# Patient Record
Sex: Male | Born: 1990 | Race: White | Hispanic: No | Marital: Single | Smoking: Current every day smoker
Health system: Southern US, Community
[De-identification: ages and names within clinical notes are randomized; demographics above are authoritative.]

## PROBLEM LIST (undated history)

## (undated) DIAGNOSIS — F32A Depression, unspecified: Secondary | ICD-10-CM

## (undated) DIAGNOSIS — F319 Bipolar disorder, unspecified: Secondary | ICD-10-CM

## (undated) DIAGNOSIS — F429 Obsessive-compulsive disorder, unspecified: Secondary | ICD-10-CM

## (undated) DIAGNOSIS — F329 Major depressive disorder, single episode, unspecified: Secondary | ICD-10-CM

## (undated) DIAGNOSIS — F419 Anxiety disorder, unspecified: Secondary | ICD-10-CM

---
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
## 2012-10-31 ENCOUNTER — Encounter (HOSPITAL_COMMUNITY): Payer: Self-pay | Admitting: Emergency Medicine

## 2012-10-31 ENCOUNTER — Emergency Department (HOSPITAL_COMMUNITY)
Admission: EM | Admit: 2012-10-31 | Discharge: 2012-10-31 | Disposition: A | Discharge status: Home / Self Care | Payer: Medicare Other | Attending: Emergency Medicine | Admitting: Emergency Medicine

## 2012-10-31 DIAGNOSIS — K089 Disorder of teeth and supporting structures, unspecified: Secondary | ICD-10-CM | POA: Insufficient documentation

## 2012-10-31 DIAGNOSIS — R000 Tachycardia, unspecified: Secondary | ICD-10-CM | POA: Insufficient documentation

## 2012-10-31 DIAGNOSIS — F17200 Nicotine dependence, unspecified, uncomplicated: Secondary | ICD-10-CM | POA: Insufficient documentation

## 2012-10-31 DIAGNOSIS — K0889 Other specified disorders of teeth and supporting structures: Secondary | ICD-10-CM

## 2012-10-31 MED ORDER — IBUPROFEN 800 MG PO TABS: 800.0000 mg | ORAL_TABLET | Freq: Three times a day (TID) | ORAL | Status: DC

## 2012-10-31 MED ORDER — TRAMADOL HCL 50 MG PO TABS
50.0000 mg | ORAL_TABLET | Freq: Once | ORAL | Status: AC
  Administered 2012-10-31: 50 mg via ORAL
  Filled 2012-10-31: qty 1

## 2012-10-31 MED ORDER — IBUPROFEN 800 MG PO TABS
800.0000 mg | ORAL_TABLET | Freq: Once | ORAL | Status: AC
  Administered 2012-10-31: 800 mg via ORAL
  Filled 2012-10-31: qty 1

## 2012-10-31 MED ORDER — TRAMADOL HCL 50 MG PO TABS: 50.0000 mg | ORAL_TABLET | Freq: Four times a day (QID) | ORAL | Status: DC | PRN

## 2012-10-31 MED ORDER — PENICILLIN V POTASSIUM 500 MG PO TABS: 500.0000 mg | ORAL_TABLET | Freq: Four times a day (QID) | ORAL | Status: DC

## 2012-10-31 NOTE — ED Notes (Signed)
Patient c/o left upper molar pain; states feels like a "pus pocket" under tooth.

## 2012-10-31 NOTE — ED Provider Notes (Signed)
CSN: [id number]     Arrival date & time 10/31/12  0245 History   First MD Initiated Contact with Patient 10/31/12 0248     Chief Complaint  Patient presents with  . Dental Pain   (Consider location/radiation/quality/duration/timing/severity/associated sxs/prior Treatment) Patient is a 22 y.o. male presenting with tooth pain. The history is provided by the patient.  Dental Pain Associated symptoms: no congestion, no drooling, no facial swelling, no fever, no headaches and no neck pain    patient with complaint of left upper molar pain feels like his of pus pocket underneath the tooth. Patient's been having pain there for a few days. Pain is currently 10 out of 10. Throbbing ache has gotten worse in the past 2 days. Not made better or worse by anything.  History reviewed. No pertinent past medical history. History reviewed. No pertinent past surgical history. No family history on file. History  Substance Use Topics  . Smoking status: Current Every Day Smoker  . Smokeless tobacco: Not on file  . Alcohol Use: Yes    Review of Systems  Constitutional: Negative for fever.  HENT: Positive for dental problem. Negative for congestion, facial swelling, drooling and neck pain.   Eyes: Negative for redness.  Respiratory: Negative for shortness of breath.   Cardiovascular: Negative for chest pain.  Gastrointestinal: Negative for abdominal pain.  Skin: Negative for rash.  Neurological: Negative for headaches.  Psychiatric/Behavioral: Negative for confusion.    Allergies  Clindamycin/lincomycin and Tylenol  Home Medications   Current Outpatient Rx  Name  Route  Sig  Dispense  Refill  . ibuprofen (ADVIL,MOTRIN) 800 MG tablet   Oral   Take 1 tablet (800 mg total) by mouth 3 (three) times daily.   21 tablet   0   . penicillin v potassium (VEETID) 500 MG tablet   Oral   Take 1 tablet (500 mg total) by mouth 4 (four) times daily.   40 tablet   0   . traMADol (ULTRAM) 50 MG tablet  Oral   Take 1 tablet (50 mg total) by mouth every 6 (six) hours as needed.   20 tablet   0    BP 168/104  Pulse 101  Temp(Src) 98 F (36.7 C) (Oral)  Resp 20  Ht 5\' 11"  (1.803 m)  Wt 264 lb (119.75 kg)  BMI 36.84 kg/m2  SpO2 99% Physical Exam  Nursing note and vitals reviewed. Constitutional: He is oriented to person, place, and time. He appears well-developed and well-nourished. No distress.  HENT:  Head: Normocephalic and atraumatic.  Mouth/Throat: Oropharynx is clear and moist.  Except for left upper last molar with an avulsion fracture and significant decay. No gum swelling no facial swelling no bleeding no purulent discharge.  Eyes: Conjunctivae and EOM are normal. Pupils are equal, round, and reactive to light.  Neck: Normal range of motion.  Cardiovascular: Normal rate, regular rhythm and normal heart sounds.   Borderline tachycardia.  Pulmonary/Chest: Effort normal and breath sounds normal. No respiratory distress.  Abdominal: Soft. Bowel sounds are normal. There is no tenderness.  Lymphadenopathy:    He has no cervical adenopathy.  Neurological: He is alert and oriented to person, place, and time. No cranial nerve deficit. He exhibits normal muscle tone. Coordination normal.  Skin: Skin is warm. No rash noted. No erythema.    ED Course  Procedures (including critical care time) Labs Review Labs Reviewed - No data to display Imaging Review No results found.  MDM   1.  Toothache    Patient with left upper last molar with significant avulsion fracture indicated. No evidence of significant gum swelling or facial swelling. Will treat with antibiotic pain medicine anti-inflammatory medicine.    Shelda Jakes, MD 10/31/12 [phone]

## 2012-12-28 ENCOUNTER — Emergency Department (HOSPITAL_COMMUNITY)
Admission: EM | Admit: 2012-12-28 | Discharge: 2012-12-28 | Disposition: A | Discharge status: Home / Self Care | Payer: Medicare Other | Attending: Emergency Medicine | Admitting: Emergency Medicine

## 2012-12-28 ENCOUNTER — Emergency Department (HOSPITAL_COMMUNITY): Payer: Medicare Other

## 2012-12-28 ENCOUNTER — Encounter (HOSPITAL_COMMUNITY): Payer: Self-pay | Admitting: Emergency Medicine

## 2012-12-28 DIAGNOSIS — S4980XA Other specified injuries of shoulder and upper arm, unspecified arm, initial encounter: Secondary | ICD-10-CM | POA: Insufficient documentation

## 2012-12-28 DIAGNOSIS — S43491A Other sprain of right shoulder joint, initial encounter: Secondary | ICD-10-CM

## 2012-12-28 DIAGNOSIS — S46909A Unspecified injury of unspecified muscle, fascia and tendon at shoulder and upper arm level, unspecified arm, initial encounter: Secondary | ICD-10-CM | POA: Insufficient documentation

## 2012-12-28 DIAGNOSIS — S43431A Superior glenoid labrum lesion of right shoulder, initial encounter: Secondary | ICD-10-CM

## 2012-12-28 DIAGNOSIS — Y9389 Activity, other specified: Secondary | ICD-10-CM | POA: Insufficient documentation

## 2012-12-28 DIAGNOSIS — F17200 Nicotine dependence, unspecified, uncomplicated: Secondary | ICD-10-CM | POA: Insufficient documentation

## 2012-12-28 DIAGNOSIS — Y92410 Unspecified street and highway as the place of occurrence of the external cause: Secondary | ICD-10-CM | POA: Insufficient documentation

## 2012-12-28 DIAGNOSIS — M549 Dorsalgia, unspecified: Secondary | ICD-10-CM

## 2012-12-28 DIAGNOSIS — IMO0002 Reserved for concepts with insufficient information to code with codable children: Secondary | ICD-10-CM | POA: Insufficient documentation

## 2012-12-28 MED ORDER — OXYCODONE-ACETAMINOPHEN 5-325 MG PO TABS
2.0000 | ORAL_TABLET | Freq: Once | ORAL | Status: AC
  Administered 2012-12-28: 2 via ORAL
  Filled 2012-12-28: qty 2

## 2012-12-28 MED ORDER — OXYCODONE-ACETAMINOPHEN 5-325 MG PO TABS: 2.0000 | ORAL_TABLET | ORAL | Status: DC | PRN

## 2012-12-28 NOTE — Discharge Instructions (Signed)
Shoulder Fracture Followup with Dr. Hilda Lias this week. Return to the ED if you develop they're worsening symptoms including weakness, numbness or tingling. You have a fractured humerus (bone in the upper arm) at the shoulder just below the ball of the shoulder joint. Most of the time the bones of a broken shoulder are in an acceptable position. Usually the injury can be treated with a shoulder immobilizer or sling and swath bandage. These devices support the arm and prevent any shoulder movement. If the bones are not in a good position, then surgery is sometimes needed. Shoulder fractures usually cause swelling, pain, and discoloration around the upper arm initially. They heal in 8-12 weeks with proper treatment. Rest in bed or a reclining chair as long as your shoulder is very painful. Sitting up generally results in less pain at the fracture site. Do not remove your shoulder bandage until your caregiver approves. You may apply ice packs over the shoulder for 20-30 minutes every 2 hours for the next 2-3 days to reduce the pain and swelling. Use your pain medicine as prescribed.  SEEK IMMEDIATE MEDICAL CARE IF:  You develop severe shoulder pain unrelieved by rest and taking pain medicine.  You have pain, numbness, tingling, or weakness in the hand or wrist.  You develop shortness of breath, chest pain, severe weakness, or fainting.  You have severe pain with motion of the fingers or wrist. MAKE SURE YOU:   Understand these instructions.  Will watch your condition.  Will get help right away if you are not doing well or get worse. Document Released: 02/22/2004 Document Revised: 04/08/2011 Document Reviewed: 05/04/2008 [facility] Patient Information 2014 [place], [place].

## 2012-12-28 NOTE — ED Notes (Signed)
MD at bedside. 
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
## 2013-02-03 ENCOUNTER — Ambulatory Visit (HOSPITAL_COMMUNITY): Admission: RE | Admit: 2013-02-03 | Payer: Medicare Other | Source: Ambulatory Visit | Admitting: Specialist

## 2013-02-18 ENCOUNTER — Ambulatory Visit (HOSPITAL_COMMUNITY): Payer: Medicare Other | Admitting: Occupational Therapy

## 2013-02-19 ENCOUNTER — Ambulatory Visit (HOSPITAL_COMMUNITY): Payer: Medicare Other | Admitting: Occupational Therapy

## 2013-03-08 ENCOUNTER — Emergency Department (HOSPITAL_COMMUNITY): Payer: Medicare Other

## 2013-03-08 ENCOUNTER — Encounter (HOSPITAL_COMMUNITY): Payer: Self-pay | Admitting: Emergency Medicine

## 2013-03-08 ENCOUNTER — Emergency Department (HOSPITAL_COMMUNITY)
Admission: EM | Admit: 2013-03-08 | Discharge: 2013-03-08 | Disposition: A | Discharge status: Home / Self Care | Payer: Medicare Other | Attending: Emergency Medicine | Admitting: Emergency Medicine

## 2013-03-08 DIAGNOSIS — K760 Fatty (change of) liver, not elsewhere classified: Secondary | ICD-10-CM

## 2013-03-08 DIAGNOSIS — I10 Essential (primary) hypertension: Secondary | ICD-10-CM

## 2013-03-08 DIAGNOSIS — Z8659 Personal history of other mental and behavioral disorders: Secondary | ICD-10-CM | POA: Insufficient documentation

## 2013-03-08 DIAGNOSIS — R1011 Right upper quadrant pain: Secondary | ICD-10-CM | POA: Insufficient documentation

## 2013-03-08 DIAGNOSIS — R109 Unspecified abdominal pain: Secondary | ICD-10-CM

## 2013-03-08 DIAGNOSIS — F17200 Nicotine dependence, unspecified, uncomplicated: Secondary | ICD-10-CM | POA: Insufficient documentation

## 2013-03-08 DIAGNOSIS — K7689 Other specified diseases of liver: Secondary | ICD-10-CM | POA: Insufficient documentation

## 2013-03-08 DIAGNOSIS — R1110 Vomiting, unspecified: Secondary | ICD-10-CM | POA: Insufficient documentation

## 2013-03-08 DIAGNOSIS — R197 Diarrhea, unspecified: Secondary | ICD-10-CM | POA: Insufficient documentation

## 2013-03-08 HISTORY — DX: Obsessive-compulsive disorder, unspecified: F42.9

## 2013-03-08 HISTORY — DX: Major depressive disorder, single episode, unspecified: F32.9

## 2013-03-08 HISTORY — DX: Depression, unspecified: F32.A

## 2013-03-08 HISTORY — DX: Bipolar disorder, unspecified: F31.9

## 2013-03-08 HISTORY — DX: Anxiety disorder, unspecified: F41.9

## 2013-03-08 LAB — CBC WITH DIFFERENTIAL/PLATELET
Basophils Absolute: 0 10*3/uL (ref 0.0–0.1)
Basophils Relative: 0 % (ref 0–1)
EOS ABS: 0.1 10*3/uL (ref 0.0–0.7)
EOS PCT: 0 % (ref 0–5)
HEMATOCRIT: 49.3 % (ref 39.0–52.0)
Hemoglobin: 17.8 g/dL — ABNORMAL HIGH (ref 13.0–17.0)
Lymphocytes Relative: 31 % (ref 12–46)
Lymphs Abs: 3.9 10*3/uL (ref 0.7–4.0)
MCH: 30.7 pg (ref 26.0–34.0)
MCHC: 36.1 g/dL — ABNORMAL HIGH (ref 30.0–36.0)
MCV: 85 fL (ref 78.0–100.0)
MONOS PCT: 7 % (ref 3–12)
Monocytes Absolute: 1 10*3/uL (ref 0.1–1.0)
Neutro Abs: 7.9 10*3/uL — ABNORMAL HIGH (ref 1.7–7.7)
Neutrophils Relative %: 61 % (ref 43–77)
PLATELETS: 244 10*3/uL (ref 150–400)
RBC: 5.8 MIL/uL (ref 4.22–5.81)
RDW: 12.7 % (ref 11.5–15.5)
WBC: 12.8 10*3/uL — ABNORMAL HIGH (ref 4.0–10.5)

## 2013-03-08 LAB — COMPREHENSIVE METABOLIC PANEL
ALT: 30 U/L (ref 0–53)
AST: 21 U/L (ref 0–37)
Albumin: 4.7 g/dL (ref 3.5–5.2)
Alkaline Phosphatase: 51 U/L (ref 39–117)
BUN: 11 mg/dL (ref 6–23)
CALCIUM: 10.5 mg/dL (ref 8.4–10.5)
CO2: 23 meq/L (ref 19–32)
CREATININE: 1 mg/dL (ref 0.50–1.35)
Chloride: 103 mEq/L (ref 96–112)
GLUCOSE: 80 mg/dL (ref 70–99)
Potassium: 3.6 mEq/L — ABNORMAL LOW (ref 3.7–5.3)
SODIUM: 142 meq/L (ref 137–147)
TOTAL PROTEIN: 8.6 g/dL — AB (ref 6.0–8.3)
Total Bilirubin: 0.9 mg/dL (ref 0.3–1.2)

## 2013-03-08 LAB — URINALYSIS, ROUTINE W REFLEX MICROSCOPIC
GLUCOSE, UA: NEGATIVE mg/dL
Hgb urine dipstick: NEGATIVE
Ketones, ur: 15 mg/dL — AB
Leukocytes, UA: NEGATIVE
Nitrite: NEGATIVE
Protein, ur: NEGATIVE mg/dL
Specific Gravity, Urine: 1.02 (ref 1.005–1.030)
UROBILINOGEN UA: 1 mg/dL (ref 0.0–1.0)
pH: 7 (ref 5.0–8.0)

## 2013-03-08 LAB — LIPASE, BLOOD: Lipase: 19 U/L (ref 11–59)

## 2013-03-08 MED ORDER — OXYCODONE-ACETAMINOPHEN 5-325 MG PO TABS
1.0000 | ORAL_TABLET | Freq: Once | ORAL | Status: AC
  Administered 2013-03-08: 1 via ORAL
  Filled 2013-03-08: qty 1

## 2013-03-08 MED ORDER — OMEPRAZOLE 20 MG PO CPDR: 20.0000 mg | DELAYED_RELEASE_CAPSULE | Freq: Every day | ORAL | Status: DC

## 2013-03-08 MED ORDER — OXYCODONE-ACETAMINOPHEN 5-325 MG PO TABS: 1.0000 | ORAL_TABLET | Freq: Three times a day (TID) | ORAL | Status: DC | PRN

## 2013-03-08 MED ORDER — HYDROMORPHONE HCL PF 1 MG/ML IJ SOLN
1.0000 mg | Freq: Once | INTRAMUSCULAR | Status: AC
  Administered 2013-03-08: 1 mg via INTRAVENOUS
  Filled 2013-03-08: qty 1

## 2013-03-08 MED ORDER — OXYCODONE-ACETAMINOPHEN 5-325 MG PO TABS: 1.0000 | ORAL_TABLET | Freq: Once | ORAL | Status: DC

## 2013-03-08 MED ORDER — SODIUM CHLORIDE 0.9 % IV BOLUS (SEPSIS)
1000.0000 mL | Freq: Once | INTRAVENOUS | Status: AC
  Administered 2013-03-08: 1000 mL via INTRAVENOUS

## 2013-03-08 MED ORDER — IOHEXOL 300 MG/ML  SOLN
50.0000 mL | Freq: Once | INTRAMUSCULAR | Status: AC | PRN
Start: 2013-03-08 — End: 2013-03-08
  Administered 2013-03-08: 50 mL via ORAL

## 2013-03-08 MED ORDER — IOHEXOL 300 MG/ML  SOLN
100.0000 mL | Freq: Once | INTRAMUSCULAR | Status: AC | PRN
  Administered 2013-03-08: 100 mL via INTRAVENOUS

## 2013-03-08 MED ORDER — ONDANSETRON 8 MG PO TBDP: 8.0000 mg | ORAL_TABLET | Freq: Once | ORAL | Status: DC

## 2013-03-08 MED ORDER — ONDANSETRON HCL 4 MG/2ML IJ SOLN
4.0000 mg | Freq: Once | INTRAMUSCULAR | Status: AC
  Administered 2013-03-08: 4 mg via INTRAVENOUS
  Filled 2013-03-08: qty 2

## 2013-03-08 NOTE — ED Notes (Signed)
Patient c/o right flank/upper abd pain x4 days. Patient reports nausea, vomiting, and diarrhea. Denies any fevers or urinary symptoms.

## 2013-03-08 NOTE — ED Notes (Signed)
Pt requesting additional pain medication.  Explained to pt that he received his third dose approximately 30 minutes ago, and at this time, medication has not had time to reach full effect.

## 2013-03-08 NOTE — ED Provider Notes (Signed)
CSN: [id number]     Arrival date & time 03/08/13  1309 History   First MD Initiated Contact with Patient 03/08/13 1556     Chief Complaint  Patient presents with  . Flank Pain     Patient is a 23 y.o. male presenting with abdominal pain. The history is provided by the patient.  Abdominal Pain Pain location:  RUQ Pain quality: sharp   Pain radiates to:  Does not radiate Pain severity:  Severe Onset quality:  Gradual Duration:  4 days Timing:  Constant Progression:  Worsening Chronicity:  New Relieved by:  Nothing Worsened by:  Palpation Associated symptoms: diarrhea and vomiting   Associated symptoms: no chest pain and no fever   pt reports RUQ pain for past 4 days that is not improving He has never had this before.      Past Medical History  Diagnosis Date  . Anxiety   . Depression   . OCD (obsessive compulsive disorder)   . Bipolar 1 disorder    History reviewed. No pertinent past surgical history. Family History  Problem Relation Age of Onset  . Heart attack Father   . Cancer Other   . Diabetes Other    History  Substance Use Topics  . Smoking status: Current Every Day Smoker -- 0.50 packs/day for 10 years    Types: Cigarettes  . Smokeless tobacco: Former NeurosurgeonUser    Types: Chew, Snuff  . Alcohol Use: Yes     Comment: occ    Review of Systems  Constitutional: Negative for fever.  Cardiovascular: Negative for chest pain.  Gastrointestinal: Positive for vomiting, abdominal pain and diarrhea.  All other systems reviewed and are negative.      Allergies  Clindamycin/lincomycin  Home Medications   Current Outpatient Rx  Name  Route  Sig  Dispense  Refill  . ibuprofen (ADVIL,MOTRIN) 200 MG tablet   Oral   Take 600 mg by mouth every 8 (eight) hours as needed.           BP 170/100  Pulse 87  Temp(Src) 97.9 F (36.6 C)  Resp 22  Ht 6' (1.829 m)  Wt 240 lb (108.863 kg)  BMI 32.54 kg/m2  SpO2 100% Physical Exam CONSTITUTIONAL: Well developed/well  nourished HEAD: Normocephalic/atraumatic EYES: EOMI/PERRL, no icterus ENMT: Mucous membranes moist NECK: supple no meningeal signs SPINE:entire spine nontender CV: S1/S2 noted, no murmurs/rubs/gallops noted LUNGS: Lungs are clear to auscultation bilaterally, no apparent distress ABDOMEN: soft, moderate RUQ tenderness noted, no rebound or guarding GU:no cva tenderness NEURO: Pt is awake/alert, moves all extremitiesx4 EXTREMITIES: pulses normal, full ROM SKIN: warm, color normal PSYCH: uncomfortable appearing  ED Course  Procedures (including critical care time) 5:25 PM Pt with continued abd pain, no signs of biliary colic on US imaging Will proceed with CT imaging      At time of discharge, pt was improved His abdomen was soft.  He had point tenderness to RUQ but no RLQ tenderness.   His CT imaging was negative Labs were reassuring He was informed of fatty infiltration of liver and need for f/u He may benefit from outpatient HIDA scan.   At this point I feel he is safe for d/c home and outpatient workup We discussed strict return precautions   Labs Review Labs Reviewed  URINALYSIS, ROUTINE W REFLEX MICROSCOPIC  CBC WITH DIFFERENTIAL  COMPREHENSIVE METABOLIC PANEL  LIPASE, BLOOD   Imaging Review [place] Abdomen Limited Ruq  03/08/2013   CLINICAL DATA:  Upper abdominal  pain  EXAM: US ABDOMEN LIMITED - RIGHT UPPER QUADRANT  COMPARISON:  None.  FINDINGS: Gallbladder:  No gallstones or wall thickening visualized. There is no pericholecystic fluid. No sonographic Murphy sign noted.  Common bile duct:  Diameter: 3 mm. There is no intrahepatic or extrahepatic biliary duct dilatation.  Liver:  Liver is prominent, measuring 15.7 cm in length. There is diffuse increased echogenicity in the liver. No focal liver lesions are identified.  IMPRESSION: Liver is prominent with increased echogenicity, most likely indicative of fatty change. While no focal liver lesions are identified, it must be  cautioned that the sensitivity of ultrasound for focal liver lesions is diminished in this circumstance. Study otherwise unremarkable.   Electronically Signed   By: Bretta Bang M.D.   On: 03/08/2013 16:55    EKG Interpretation   None       MDM   Final diagnoses:  None    Nursing notes including past medical history and social history reviewed and considered in documentation Labs/vital reviewed and considered     Joya Gaskins, MD 03/08/13 2205

## 2013-03-08 NOTE — Discharge Instructions (Signed)
Arterial Hypertension °Arterial hypertension (high blood pressure) is a condition of elevated pressure in your blood vessels. Hypertension over a long period of time is a risk factor for strokes, heart attacks, and heart failure. It is also the leading cause of kidney (renal) failure.  °CAUSES  °· In Adults -- Over 90% of all hypertension has no known cause. This is called essential or primary hypertension. In the other 10% of people with hypertension, the increase in blood pressure is caused by another disorder. This is called secondary hypertension. Important causes of secondary hypertension are: °· Heavy alcohol use. °· Obstructive sleep apnea. °· Hyperaldosterosim (Conn's syndrome). °· Steroid use. °· Chronic kidney failure. °· Hyperparathyroidism. °· Medications. °· Renal artery stenosis. °· Pheochromocytoma. °· Cushing's disease. °· Coarctation of the aorta. °· Scleroderma renal crisis. °· Licorice (in excessive amounts). °· Drugs (cocaine, methamphetamine). °Your caregiver can explain any items above that apply to you. °· In Children -- Secondary hypertension is more common and should always be considered. °· Pregnancy -- Few women of childbearing age have high blood pressure. However, up to 10% of them develop hypertension of pregnancy. Generally, this will not harm the woman. It may be a sign of 3 complications of pregnancy: preeclampsia, HELLP syndrome, and eclampsia. Follow up and control with medication is necessary. °SYMPTOMS  °· This condition normally does not produce any noticeable symptoms. It is usually found during a routine exam. °· Malignant hypertension is a late problem of high blood pressure. It may have the following symptoms: °· Headaches. °· Blurred vision. °· End-organ damage (this means your kidneys, heart, lungs, and other organs are being damaged). °· Stressful situations can increase the blood pressure. If a person with normal blood pressure has their blood pressure go up while being  seen by their caregiver, this is often termed "white coat hypertension." Its importance is not known. It may be related with eventually developing hypertension or complications of hypertension. °· Hypertension is often confused with mental tension, stress, and anxiety. °DIAGNOSIS  °The diagnosis is made by 3 separate blood pressure measurements. They are taken at least 1 week apart from each other. If there is organ damage from hypertension, the diagnosis may be made without repeat measurements. °Hypertension is usually identified by having blood pressure readings: °· Above 140/90 mmHg measured in both arms, at 3 separate times, over a couple weeks. °· Over 130/80 mmHg should be considered a risk factor and may require treatment in patients with diabetes. °Blood pressure readings over 120/80 mmHg are called "pre-hypertension" even in non-diabetic patients. °To get a true blood pressure measurement, use the following guidelines. Be aware of the factors that can alter blood pressure readings. °· Take measurements at least 1 hour after caffeine. °· Take measurements 30 minutes after smoking and without any stress. This is another reason to quit smoking  it raises your blood pressure. °· Use a proper cuff size. Ask your caregiver if you are not sure about your cuff size. °· Most home blood pressure cuffs are automatic. They will measure systolic and diastolic pressures. The systolic pressure is the pressure reading at the start of sounds. Diastolic pressure is the pressure at which the sounds disappear. If you are elderly, measure pressures in multiple postures. Try sitting, lying or standing. °· Sit at rest for a minimum of 5 minutes before taking measurements. °· You should not be on any medications like decongestants. These are found in many cold medications. °· Record your blood pressure readings and review   them with your caregiver. °If you have hypertension: °· Your caregiver may do tests to be sure you do not have  secondary hypertension (see "causes" above). °· Your caregiver may also look for signs of metabolic syndrome. This is also called Syndrome X or Insulin Resistance Syndrome. You may have this syndrome if you have type 2 diabetes, abdominal obesity, and abnormal blood lipids in addition to hypertension. °· Your caregiver will take your medical and family history and perform a physical exam. °· Diagnostic tests may include blood tests (for glucose, cholesterol, potassium, and kidney function), a urinalysis, or an EKG. Other tests may also be necessary depending on your condition. °PREVENTION  °There are important lifestyle issues that you can adopt to reduce your chance of developing hypertension: °· Maintain a normal weight. °· Limit the amount of salt (sodium) in your diet. °· Exercise often. °· Limit alcohol intake. °· Get enough potassium in your diet. Discuss specific advice with your caregiver. °· Follow a DASH diet (dietary approaches to stop hypertension). This diet is rich in fruits, vegetables, and low-fat dairy products, and avoids certain fats. °PROGNOSIS  °Essential hypertension cannot be cured. Lifestyle changes and medical treatment can lower blood pressure and reduce complications. The prognosis of secondary hypertension depends on the underlying cause. Many people whose hypertension is controlled with medicine or lifestyle changes can live a normal, healthy life.  °RISKS AND COMPLICATIONS  °While high blood pressure alone is not an illness, it often requires treatment due to its short- and long-term effects on many organs. Hypertension increases your risk for: °· CVAs or strokes (cerebrovascular accident). °· Heart failure due to chronically high blood pressure (hypertensive cardiomyopathy). °· Heart attack (myocardial infarction). °· Damage to the retina (hypertensive retinopathy). °· Kidney failure (hypertensive nephropathy). °Your caregiver can explain list items above that apply to you. Treatment  of hypertension can significantly reduce the risk of complications. °TREATMENT  °· For overweight patients, weight loss and regular exercise are recommended. Physical fitness lowers blood pressure. °· Mild hypertension is usually treated with diet and exercise. A diet rich in fruits and vegetables, fat-free dairy products, and foods low in fat and salt (sodium) can help lower blood pressure. Decreasing salt intake decreases blood pressure in a 1/3 of people. °· Stop smoking if you are a smoker. °The steps above are highly effective in reducing blood pressure. While these actions are easy to suggest, they are difficult to achieve. Most patients with moderate or severe hypertension end up requiring medications to bring their blood pressure down to a normal level. There are several classes of medications for treatment. Blood pressure pills (antihypertensives) will lower blood pressure by their different actions. Lowering the blood pressure by 10 mmHg may decrease the risk of complications by as much as 25%. °The goal of treatment is effective blood pressure control. This will reduce your risk for complications. Your caregiver will help you determine the best treatment for you according to your lifestyle. What is excellent treatment for one person, may not be for you. °HOME CARE INSTRUCTIONS  °· Do not smoke. °· Follow the lifestyle changes outlined in the "Prevention" section. °· If you are on medications, follow the directions carefully. Blood pressure medications must be taken as prescribed. Skipping doses reduces their benefit. It also puts you at risk for problems. °· Follow up with your caregiver, as directed. °· If you are asked to monitor your blood pressure at home, follow the guidelines in the "Diagnosis" section above. °SEEK MEDICAL CARE   IF:   You think you are having medication side effects.  You have recurrent headaches or lightheadedness.  You have swelling in your ankles.  You have trouble with  your vision. SEEK IMMEDIATE MEDICAL CARE IF:   You have sudden onset of chest pain or pressure, difficulty breathing, or other symptoms of a heart attack.  You have a severe headache.  You have symptoms of a stroke (such as sudden weakness, difficulty speaking, difficulty walking). MAKE SURE YOU:   Understand these instructions.  Will watch your condition.  Will get help right away if you are not doing well or get worse. Document Released: 01/14/2005 Document Revised: 04/08/2011 Document Reviewed: 08/14/2006 [facility] Patient Information 2014 [place], [place].   Abdominal (belly) pain can be caused by many things. any cases can be observed and treated at home after initial evaluation in the emergency department. Even though you are being discharged home, abdominal pain can be unpredictable. Therefore, you need a repeated exam if your pain does not resolve, returns, or worsens. Most patients with abdominal pain don't have to be admitted to the hospital or have surgery, but serious problems like appendicitis and gallbladder attacks can start out as nonspecific pain. Many abdominal conditions cannot be diagnosed in one visit, so follow-up evaluations are very important. SEEK IMMEDIATE MEDICAL ATTENTION IF: The pain does not go away or becomes severe, particularly over the next 8-12 hours.  A temperature above 100.59F develops.  Repeated vomiting occurs (multiple episodes).  The pain becomes localized to portions of the abdomen. The right side could possibly be appendicitis. In an adult, the left lower portion of the abdomen could be colitis or diverticulitis.  Blood is being passed in stools or vomit (bright red or black tarry stools).  Return also if you develop chest pain, difficulty breathing, dizziness or fainting, or become confused, poorly responsive, or inconsolable (young children).

## 2013-03-10 ENCOUNTER — Ambulatory Visit (INDEPENDENT_AMBULATORY_CARE_PROVIDER_SITE_OTHER): Payer: Medicare Other | Admitting: Gastroenterology

## 2013-03-10 ENCOUNTER — Encounter: Payer: Self-pay | Admitting: Gastroenterology

## 2013-03-10 ENCOUNTER — Other Ambulatory Visit: Payer: Self-pay | Admitting: Gastroenterology

## 2013-03-10 VITALS — BP 144/84 | HR 90 | Temp 98.9°F | Ht 72.0 in | Wt 273.6 lb

## 2013-03-10 DIAGNOSIS — R109 Unspecified abdominal pain: Secondary | ICD-10-CM

## 2013-03-10 DIAGNOSIS — R1011 Right upper quadrant pain: Secondary | ICD-10-CM

## 2013-03-10 MED ORDER — IBUPROFEN 600 MG PO TABS: ORAL_TABLET | ORAL | Status: DC

## 2013-03-10 NOTE — Patient Instructions (Addendum)
GET YOUR CHEST XRAY/RIB FLIMS TODAY.  FOLLOW UP WITH PHYSICAL THERAPY.  YOU MAY USE IBUPROFEN 600 MG TID FOR 10 DAYS. CALL IF YOU CHANGE YOUR MIND ABOUT THE STEROIDS.  USE NEURONTIN THREE TIMES A DAY TO HELP WITH PAIN.  ICE TO RIBS/RIGHT UPPER ABDOMEN 10 MINS THREE TIMES A DAY.  UPPER ENDOSCOPY ON FEB 16 WITH PROPOFOL.  FOLLOW UP IN 2 MOS.

## 2013-03-10 NOTE — Progress Notes (Addendum)
Subjective:    Patient ID: Christopher Houston, male    DOB: 1990-05-07, 23 y.o.   MRN: [id number] CLAGGETT,ELIN, PA-C  HPI Pain in ruq for 5 days. STARTED WHEN HE WAS OUT IN THE SHOP BUSTING WOOD FOR ABOUT AN HOUR AND AFTER 2 HOURS THEN STARTED MOWING GRASS THAT DAY 2-3 DAYS AFTER PAIN WOKE UP WITH HOT AND COLD SWEATS/VOMITING/LOOSE STOOLS. HARD TO KEEP ANYTHING. USES IBUPROFEN: 2X/DAY. NO ASPIRIN, BC/GOODY POWDERS, OR NAPROXEN/ALEVE. RARE ETOH. DOESN'T THINK IT'S A PULLED MUSCLE. PAIN WORSE WITH EATING, LAYING ON IT, BENDING OVER, TURNING TO RIGHT, AND TAKING A DEEP BREATH. LAST VOMITED 2 DAYS AGO. LAST DIARRHEA: THIS AM. NO WHITE STOOLS/ITCHING. PT DENIES FEVER, CHILLS, BRBPR,  melena, constipation, problems swallowing, OR problems with sedation. ICE HELPED FOR 15 MINS. PRILOSEC HELPS heartburn or indigestion. NO EGD/TCS. NO WEIGHT LOSS. NO MEDS FOR MENTAL HEALTH. BROKE R SHOULDER 3 MOS AGO RIDING A FOUR WHEELER. WAS ON PERCOCET 7.5 THEN.  Past Medical History  Diagnosis Date  . Anxiety   . Depression   . OCD (obsessive compulsive disorder)   . Bipolar 1 disorder    No past surgical history on file.  Allergies  Allergen Reactions  . Clindamycin/Lincomycin Other (See Comments)    Hot, sweaty and shaky   Current Outpatient Prescriptions  Medication Sig Dispense Refill  . ibuprofen (ADVIL,MOTRIN) 200 MG tablet Take 600 mg by mouth every 8 (eight) hours as needed.   DECREASES PAIN    . omeprazole (PRILOSEC) 20 MG capsule Take 1 capsule (20 mg total) by mouth daily.    Marland Kitchen. oxyCODONE-acetaminophen (PERCOCET/ROXICET) 5-325 MG per tablet Take 1 tablet by mouth every 8 (eight) hours as needed for severe pain. Pt said he has one tablet left       Family History  Problem Relation Age of Onset  . Heart attack Father   . Cancer Other G-FATHER, G-MOTHER  . Diabetes Other   NO COLON, GASTRIC, PANCREAS CA.  History  Substance Use Topics  . Smoking status: Current Every Day Smoker -- 0.50  packs/day for 10 years    Types: Cigarettes  . Smokeless tobacco: Former NeurosurgeonUser    Types: Snuff, Chew     Comment: 1/2 pack daily  . Alcohol Use: Yes     Comment: occ   SEXUALLY ACTIVE ACTIVE: LAST TIME 1 WEEK AGO. USES CONDOM(EVERY TIME)     Review of Systems PER HPI OTHERWISE ALL SYSTEMS ARE NEGATIVE.     Objective:   Physical Exam  Vitals reviewed. Constitutional: He is oriented to person, place, and time. He appears well-nourished. No distress.  HENT:  Head: Normocephalic and atraumatic.  Mouth/Throat: Oropharynx is clear and moist. No oropharyngeal exudate.  Eyes: Pupils are equal, round, and reactive to light. No scleral icterus.  Neck: Normal range of motion. Neck supple.  Cardiovascular: Normal rate, regular rhythm and normal heart sounds.   Pulmonary/Chest: Effort normal and breath sounds normal. No respiratory distress.  Abdominal: Soft. Bowel sounds are normal. He exhibits no distension. There is tenderness (MOD TTP IN RUQ WITH PALPATION IN OTHER QUADRANTS.). There is no rebound and no guarding.  POSTIVE CARNETT'S SIGN WITH RAISING LEGS AND HEAD/NECK OF THE EXAM  Musculoskeletal: He exhibits no edema.  Lymphadenopathy:    He has no cervical adenopathy.  Neurological: He is alert and oriented to person, place, and time.  NO FOCAL DEFICITS   Psychiatric:  FLAT AFFECT, NL MOOD  Assessment & Plan:

## 2013-03-10 NOTE — Assessment & Plan Note (Addendum)
ACUTE IN CHRONIC PAIN.PT IN OBVIOUS DISTRESS. SX MOST LIKELY DUE TO ABD WALL PAIN, LESS LIKELY RIB FRACTURE, PNEUMOTHORAX , LESS LIKELY RETROCECAL APPENDICITIS.   GET YOUR CHEST XRAY/RIB FLIMS TODAY.  FOLLOW UP WITH PHYSICAL THERAPY. USE NEURONTIN THREE TIMES A DAY TO HELP WITH PAIN. ICE TO RIBS/RIGHT UPPER ABDOMEN 10 MINS THREE TIMES A DAY. UPPER ENDOSCOPY ON FEB 16 WITH PROPOFOL. I PERSONALLY REVIEWED CT WITH DR. Tyron RussellBOLES. CONSIDER REPEAT CT IF SX NOT IMPROVED IN 7 DAYS. FOLLOW UP IN 2 MOS.

## 2013-03-11 NOTE — Progress Notes (Signed)
CALLED AND SPOKE WITH RADIOLOGY(DEE). PT FAILED TO SHOW FOR HIS CXR YESTERDAY.

## 2013-03-11 NOTE — Progress Notes (Signed)
cc'd to pcp 

## 2013-03-12 NOTE — Progress Notes (Signed)
Reminder in epic °

## 2013-03-16 ENCOUNTER — Encounter (HOSPITAL_COMMUNITY): Payer: Self-pay | Admitting: Pharmacy Technician

## 2013-03-17 ENCOUNTER — Encounter (HOSPITAL_COMMUNITY): Admission: RE | Admit: 2013-03-17 | Payer: Medicare Other | Source: Ambulatory Visit

## 2013-03-17 ENCOUNTER — Telehealth: Payer: Self-pay | Admitting: Gastroenterology

## 2013-03-17 ENCOUNTER — Telehealth: Payer: Self-pay

## 2013-03-17 NOTE — Telephone Encounter (Signed)
I have LMOM for patient to return my call.  

## 2013-03-17 NOTE — Telephone Encounter (Signed)
[facility] called to let us know that patient was a no show for his pre op

## 2013-03-17 NOTE — Telephone Encounter (Signed)
Pt return your call and would like to reschedule his EGD. He said to just call him at [phone].

## 2013-03-18 ENCOUNTER — Other Ambulatory Visit: Payer: Self-pay | Admitting: Gastroenterology

## 2013-03-18 NOTE — Telephone Encounter (Signed)
EGD w/Propofol has been R/S to 03/10 new instructions have been mailed

## 2013-03-18 NOTE — Telephone Encounter (Signed)
I LMOM for a return call

## 2013-03-29 NOTE — Patient Instructions (Signed)
Christopher Houston  03/29/2013   Your procedure is scheduled on:  04/06/13  Report to [facility] at 06:15 AM.  Call this number if you have problems the morning of surgery: [phone]   Remember:   Do not eat food or drink liquids after midnight.   Take these medicines the morning of surgery with A SIP OF WATER: Omeprazole.   Do not wear jewelry, make-up or nail polish.  Do not wear lotions, powders, or perfumes. You may wear deodorant.  Do not shave 48 hours prior to surgery. Men may shave face and neck.  Do not bring valuables to the hospital.  [facility] is not responsible for any belongings or valuables.               Contacts, dentures or bridgework may not be worn into surgery.  Leave suitcase in the car. After surgery it may be brought to your room.  For patients admitted to the hospital, discharge time is determined by your treatment team.               Patients discharged the day of surgery will not be allowed to drive home.   Special Instructions: N/A   Please read over the following fact sheets that you were given: Anesthesia Post-op Instructions and Care and Recovery After Surgery    Esophagogastroduodenoscopy Esophagogastroduodenoscopy (EGD) is a procedure to examine the lining of the esophagus, stomach, and first part of the small intestine (duodenum). A long, flexible, lighted tube with a camera attached (endoscope) is inserted down the throat to view these organs. This procedure is done to detect problems or abnormalities, such as inflammation, bleeding, ulcers, or growths, in order to treat them. The procedure lasts about 5 20 minutes. It is usually an outpatient procedure, but it may need to be performed in emergency cases in the hospital. LET YOUR CAREGIVER KNOW ABOUT:   Allergies to food or medicine.  All medicines you are taking, including vitamins, herbs, eyedrops, and over-the-counter medicines and creams.  Use of steroids (by mouth or creams).  Previous  problems you or members of your family have had with the use of anesthetics.  Any blood disorders you have.  Previous surgeries you have had.  Other health problems you have.  Possibility of pregnancy, if this applies. RISKS AND COMPLICATIONS  Generally, EGD is a safe procedure. However, as with any procedure, complications can occur. Possible complications include:  Infection.  Bleeding.  Tearing (perforation) of the esophagus, stomach, or duodenum.  Difficulty breathing or not being able to breath.  Excessive sweating.  Spasms of the larynx.  Slowed heartbeat.  Low blood pressure. BEFORE THE PROCEDURE  Do not eat or drink anything for 6 8 hours before the procedure or as directed by your caregiver.  Ask your caregiver about changing or stopping your regular medicines.  If you wear dentures, be prepared to remove them before the procedure.  Arrange for someone to drive you home after the procedure. PROCEDURE   A vein will be accessed to give medicines and fluids. A medicine to relax you (sedative) and a pain reliever will be given through that access into the vein.  A numbing medicine (local anesthetic) may be sprayed on your throat for comfort and to stop you from gagging or coughing.  A mouth guard may be placed in your mouth to protect your teeth and to keep you from biting on the endoscope.  You will be asked to lie on your left side.  The endoscope is inserted down your throat and into the esophagus, stomach, and duodenum.  Air is put through the endoscope to allow your caregiver to view the lining of your esophagus clearly.  The esophagus, stomach, and duodenum is then examined. During the exam, your caregiver may:  Remove tissue to be examined under a microscope (biopsy) for inflammation, infection, or other medical problems.  Remove growths.  Remove objects (foreign bodies) that are stuck.  Treat any bleeding with medicines or other devices that stop  tissues from bleeding (hot cauters, clipping devices).  Widen (dilate) or stretch narrowed areas of the esophagus and stomach.  The endoscope will then be withdrawn. AFTER THE PROCEDURE  You will be taken to a recovery area to be monitored. You will be able to go home once you are stable and alert.  Do not eat or drink anything until the local anesthetic and numbing medicines have worn off. You may choke.  It is normal to feel bloated, have pain with swallowing, or have a sore throat for a short time. This will wear off.  Your caregiver should be able to discuss his or her findings with you. It will take longer to discuss the test results if any biopsies were taken. Document Released: 05/17/2004 Document Revised: 01/01/2012 Document Reviewed: 12/18/2011 [facility] Patient Information 2014 [place]Care, [place].    PATIENT INSTRUCTIONS POST-ANESTHESIA  IMMEDIATELY FOLLOWING SURGERY:  Do not drive or operate machinery for the first twenty four hours after surgery.  Do not make any important decisions for twenty four hours after surgery or while taking narcotic pain medications or sedatives.  If you develop intractable nausea and vomiting or a severe headache please notify your doctor immediately.  FOLLOW-UP:  Please make an appointment with your surgeon as instructed. You do not need to follow up with anesthesia unless specifically instructed to do so.  WOUND CARE INSTRUCTIONS (if applicable):  Keep a dry clean dressing on the anesthesia/puncture wound site if there is drainage.  Once the wound has quit draining you may leave it open to air.  Generally you should leave the bandage intact for twenty four hours unless there is drainage.  If the epidural site drains for more than 36-48 hours please call the anesthesia department.  QUESTIONS?:  Please feel free to call your physician or the hospital operator if you have any questions, and they will be happy to assist you.

## 2013-03-30 ENCOUNTER — Encounter (HOSPITAL_COMMUNITY)
Admission: RE | Admit: 2013-03-30 | Discharge: 2013-03-30 | Disposition: A | Discharge status: Home / Self Care | Payer: Medicare Other | Source: Ambulatory Visit | Attending: Gastroenterology | Admitting: Gastroenterology

## 2013-03-30 ENCOUNTER — Telehealth: Payer: Self-pay | Admitting: Gastroenterology

## 2013-03-30 NOTE — Telephone Encounter (Signed)
One of the nurses from Short Stay called to let us know that patient was a no show for his Pre OP visit and they tried calling him and was told that he wasn't there and didn't know when he would be back. He is scheduled with [facility] on 3/10

## 2013-03-30 NOTE — Telephone Encounter (Signed)
I have Left a message with a family member for him to call us back

## 2013-03-31 ENCOUNTER — Encounter: Payer: Self-pay | Admitting: General Practice

## 2013-03-31 NOTE — Telephone Encounter (Signed)
Discharge letter mailed  

## 2013-03-31 NOTE — Telephone Encounter (Signed)
EGD has been canceled.

## 2013-03-31 NOTE — Telephone Encounter (Signed)
CANCEL EGD. PT NEEDS A LETTER TERMINATING THE DOCTOR PT RELATIONSHIP. HE NO SHOWED FOR XRAY AND PREOPx2.

## 2013-04-06 ENCOUNTER — Encounter (HOSPITAL_COMMUNITY): Admission: RE | Payer: Self-pay | Source: Ambulatory Visit

## 2013-04-06 ENCOUNTER — Ambulatory Visit (HOSPITAL_COMMUNITY): Admission: RE | Admit: 2013-04-06 | Payer: Medicare Other | Source: Ambulatory Visit | Admitting: Gastroenterology

## 2013-04-06 SURGERY — ESOPHAGOGASTRODUODENOSCOPY (EGD) WITH PROPOFOL
Anesthesia: Monitor Anesthesia Care

## 2013-12-03 ENCOUNTER — Emergency Department (HOSPITAL_COMMUNITY)
Admission: EM | Admit: 2013-12-03 | Discharge: 2013-12-04 | Disposition: A | Discharge status: Home / Self Care | Payer: Medicare Other | Attending: Emergency Medicine | Admitting: Emergency Medicine

## 2013-12-03 ENCOUNTER — Encounter (HOSPITAL_COMMUNITY): Payer: Self-pay | Admitting: *Deleted

## 2013-12-03 DIAGNOSIS — K029 Dental caries, unspecified: Secondary | ICD-10-CM | POA: Insufficient documentation

## 2013-12-03 DIAGNOSIS — Z720 Tobacco use: Secondary | ICD-10-CM | POA: Insufficient documentation

## 2013-12-03 DIAGNOSIS — Z8659 Personal history of other mental and behavioral disorders: Secondary | ICD-10-CM | POA: Insufficient documentation

## 2013-12-03 DIAGNOSIS — K088 Other specified disorders of teeth and supporting structures: Secondary | ICD-10-CM | POA: Diagnosis present

## 2013-12-03 DIAGNOSIS — K047 Periapical abscess without sinus: Secondary | ICD-10-CM | POA: Insufficient documentation

## 2013-12-03 NOTE — ED Notes (Signed)
Pt states he has several bad teeth on the left side of his mouth. Pt c/o pain to face.

## 2013-12-04 DIAGNOSIS — K047 Periapical abscess without sinus: Secondary | ICD-10-CM | POA: Diagnosis not present

## 2013-12-04 MED ORDER — AMOXICILLIN 500 MG PO CAPS: 500.0000 mg | ORAL_CAPSULE | Freq: Three times a day (TID) | ORAL | Status: DC

## 2013-12-04 MED ORDER — HYDROCODONE-ACETAMINOPHEN 5-325 MG PO TABS: 1.0000 | ORAL_TABLET | ORAL | Status: DC | PRN

## 2013-12-04 MED ORDER — AMOXICILLIN 250 MG PO CAPS
500.0000 mg | ORAL_CAPSULE | Freq: Once | ORAL | Status: AC
  Administered 2013-12-04: 500 mg via ORAL
  Filled 2013-12-04: qty 2

## 2013-12-04 MED ORDER — HYDROCODONE-ACETAMINOPHEN 5-325 MG PO TABS
2.0000 | ORAL_TABLET | Freq: Once | ORAL | Status: AC
  Administered 2013-12-04: 2 via ORAL
  Filled 2013-12-04: qty 2

## 2013-12-04 NOTE — Discharge Instructions (Signed)
Dental Abscess °A dental abscess is a collection of infected fluid (pus) from a bacterial infection in the inner part of the tooth (pulp). It usually occurs at the end of the tooth's root.  °CAUSES  °· Severe tooth decay. °· Trauma to the tooth that allows bacteria to enter into the pulp, such as a broken or chipped tooth. °SYMPTOMS  °· Severe pain in and around the infected tooth. °· Swelling and redness around the abscessed tooth or in the mouth or face. °· Tenderness. °· Pus drainage. °· Bad breath. °· Bitter taste in the mouth. °· Difficulty swallowing. °· Difficulty opening the mouth. °· Nausea. °· Vomiting. °· Chills. °· Swollen neck glands. °DIAGNOSIS  °· A medical and dental history will be taken. °· An examination will be performed by tapping on the abscessed tooth. °· X-rays may be taken of the tooth to identify the abscess. °TREATMENT °The goal of treatment is to eliminate the infection. You may be prescribed antibiotic medicine to stop the infection from spreading. A root canal may be performed to save the tooth. If the tooth cannot be saved, it may be pulled (extracted) and the abscess may be drained.  °HOME CARE INSTRUCTIONS °· Only take over-the-counter or prescription medicines for pain, fever, or discomfort as directed by your caregiver. °· Rinse your mouth (gargle) often with salt water (¼ tsp salt in 8 oz [250 ml] of warm water) to relieve pain or swelling. °· Do not drive after taking pain medicine (narcotics). °· Do not apply heat to the outside of your face. °· Return to your dentist for further treatment as directed. °SEEK MEDICAL CARE IF: °· Your pain is not helped by medicine. °· Your pain is getting worse instead of better. °SEEK IMMEDIATE MEDICAL CARE IF: °· You have a fever or persistent symptoms for more than 2-3 days. °· You have a fever and your symptoms suddenly get worse. °· You have chills or a very bad headache. °· You have problems breathing or swallowing. °· You have trouble  opening your mouth. °· You have swelling in the neck or around the eye. °Document Released: 01/14/2005 Document Revised: 10/09/2011 Document Reviewed: 04/24/2010 °ExitCare® Patient Information ©2015 [employer]. This information is not intended to replace advice given to you by your health care provider. Make sure you discuss any questions you have with your health care provider. ° ° °Complete your entire course of antibiotics as prescribed.  You  may use the hydrocodone for pain relief but do not drive within 4 hours of taking as this will make you drowsy.  Avoid applying heat or ice to this abscess area which can worsen your symptoms.  You may use warm salt water swish and spit treatment or half peroxide and water swish and spit after meals to keep this area clean as discussed.  Call the dentist listed above for further management of your symptoms. ° ° °

## 2013-12-04 NOTE — ED Provider Notes (Signed)
CSN: [id number]     Arrival date & time 12/03/13  2221 History   First MD Initiated Contact with Patient 12/03/13 2354     Chief Complaint  Patient presents with  . Dental Pain     (Consider location/radiation/quality/duration/timing/severity/associated sxs/prior Treatment) Patient is a 23 y.o. male presenting with tooth pain. The history is provided by the patient.  Dental Pain Location:  Upper Upper teeth location:  16/LU 3rd molar Quality:  Sharp Severity:  Severe Onset quality:  Gradual Duration:  3 days Timing:  Constant Progression:  Worsening Chronicity:  New Context: dental caries and dental fracture   Relieved by:  Nothing Worsened by:  Pressure and touching Ineffective treatments:  NSAIDs Associated symptoms: facial pain and gum swelling   Associated symptoms: no difficulty swallowing, no drooling, no facial swelling, no fever, no neck pain, no neck swelling, no oral bleeding, no oral lesions and no trismus   Risk factors: lack of dental care and smoking     Past Medical History  Diagnosis Date  . Anxiety   . Depression   . OCD (obsessive compulsive disorder)   . Bipolar 1 disorder    History reviewed. No pertinent past surgical history. Family History  Problem Relation Age of Onset  . Heart attack Father   . Cancer Other   . Diabetes Other    History  Substance Use Topics  . Smoking status: Current Every Day Smoker -- 0.50 packs/day for 10 years    Types: Cigarettes  . Smokeless tobacco: Former NeurosurgeonUser     Comment: 1/2 pack daily  . Alcohol Use: Yes     Comment: occ    Review of Systems  Constitutional: Negative for fever.  HENT: Positive for dental problem. Negative for drooling, facial swelling, mouth sores and sore throat.   Respiratory: Negative for shortness of breath.   Musculoskeletal: Negative for neck pain and neck stiffness.      Allergies  Clindamycin/lincomycin  Home Medications   Prior to Admission medications   Medication Sig  Start Date End Date Taking? Authorizing Provider  amoxicillin (AMOXIL) 500 MG capsule Take 1 capsule (500 mg total) by mouth 3 (three) times daily. 12/04/13   Burgess AmorJulie Jakhai Fant, PA-C  HYDROcodone-acetaminophen (NORCO/VICODIN) 5-325 MG per tablet Take 1 tablet by mouth every 4 (four) hours as needed for moderate pain. 12/04/13   Burgess AmorJulie Elmus Mathes, PA-C   BP 165/88 mmHg  Pulse 84  Temp(Src) 98.4 F (36.9 C) (Oral)  Resp 18  Ht 6' (1.829 m)  Wt 260 lb (117.935 kg)  BMI 35.25 kg/m2  SpO2 100% Physical Exam  Constitutional: He is oriented to person, place, and time. He appears well-developed and well-nourished. No distress.  HENT:  Head: Normocephalic and atraumatic.  Right Ear: Tympanic membrane and external ear normal.  Left Ear: Tympanic membrane and external ear normal.  Mouth/Throat: Oropharynx is clear and moist and mucous membranes are normal. No oral lesions. No trismus in the jaw. Dental abscesses and dental caries present.    Eyes: Conjunctivae are normal.  Neck: Normal range of motion. Neck supple.  Cardiovascular: Normal rate and normal heart sounds.   Pulmonary/Chest: Effort normal.  Abdominal: He exhibits no distension.  Musculoskeletal: Normal range of motion.  Lymphadenopathy:    He has no cervical adenopathy.  Neurological: He is alert and oriented to person, place, and time.  Skin: Skin is warm and dry. No erythema.  Psychiatric: He has a normal mood and affect.    ED Course  Procedures (  including critical care time) Labs Review Labs Reviewed - No data to display  Imaging Review No results found.   EKG Interpretation None      MDM   Final diagnoses:  Dental abscess    The patient appears reasonably screened and/or stabilized for discharge and I doubt any other medical condition or other [facility] requiring further screening, evaluation, or treatment in the ED at this time prior to discharge.  Pt prescribed amoxil, hydrocodone.  Dental referrals given.    Burgess AmorJulie Aleysha Meckler,  PA-C 12/04/13 1248  Hanley SeamenJohn L Molpus, MD 12/04/13 2233

## 2014-01-31 ENCOUNTER — Encounter (HOSPITAL_COMMUNITY): Payer: Self-pay | Admitting: Emergency Medicine

## 2014-01-31 ENCOUNTER — Emergency Department (HOSPITAL_COMMUNITY)
Admission: EM | Admit: 2014-01-31 | Discharge: 2014-01-31 | Disposition: A | Discharge status: Home / Self Care | Payer: Medicaid Other | Attending: Emergency Medicine | Admitting: Emergency Medicine

## 2014-01-31 DIAGNOSIS — M5431 Sciatica, right side: Secondary | ICD-10-CM | POA: Diagnosis not present

## 2014-01-31 DIAGNOSIS — Z720 Tobacco use: Secondary | ICD-10-CM | POA: Diagnosis not present

## 2014-01-31 DIAGNOSIS — Z8659 Personal history of other mental and behavioral disorders: Secondary | ICD-10-CM | POA: Insufficient documentation

## 2014-01-31 DIAGNOSIS — M545 Low back pain: Secondary | ICD-10-CM | POA: Diagnosis present

## 2014-01-31 LAB — URINALYSIS, ROUTINE W REFLEX MICROSCOPIC
Bilirubin Urine: NEGATIVE
GLUCOSE, UA: NEGATIVE mg/dL
Hgb urine dipstick: NEGATIVE
Ketones, ur: NEGATIVE mg/dL
LEUKOCYTES UA: NEGATIVE
Nitrite: NEGATIVE
PROTEIN: NEGATIVE mg/dL
Specific Gravity, Urine: 1.025 (ref 1.005–1.030)
UROBILINOGEN UA: 0.2 mg/dL (ref 0.0–1.0)
pH: 6.5 (ref 5.0–8.0)

## 2014-01-31 MED ORDER — PREDNISONE 20 MG PO TABS
40.0000 mg | ORAL_TABLET | Freq: Once | ORAL | Status: AC
  Administered 2014-01-31: 40 mg via ORAL
  Filled 2014-01-31: qty 2

## 2014-01-31 MED ORDER — KETOROLAC TROMETHAMINE 60 MG/2ML IM SOLN
60.0000 mg | Freq: Once | INTRAMUSCULAR | Status: AC
  Administered 2014-01-31: 60 mg via INTRAMUSCULAR
  Filled 2014-01-31: qty 2

## 2014-01-31 MED ORDER — CYCLOBENZAPRINE HCL 10 MG PO TABS
10.0000 mg | ORAL_TABLET | Freq: Once | ORAL | Status: AC
  Administered 2014-01-31: 10 mg via ORAL
  Filled 2014-01-31: qty 1

## 2014-01-31 MED ORDER — PREDNISONE 20 MG PO TABS: 40.0000 mg | ORAL_TABLET | Freq: Once | ORAL | Status: DC

## 2014-01-31 MED ORDER — NAPROXEN 500 MG PO TABS: 500.0000 mg | ORAL_TABLET | Freq: Two times a day (BID) | ORAL | Status: DC

## 2014-01-31 MED ORDER — OXYCODONE-ACETAMINOPHEN 5-325 MG PO TABS: 2.0000 | ORAL_TABLET | ORAL | Status: DC | PRN

## 2014-01-31 MED ORDER — PREDNISONE 10 MG PO TABS: ORAL_TABLET | ORAL | Status: DC

## 2014-01-31 MED ORDER — OXYCODONE-ACETAMINOPHEN 5-325 MG PO TABS
1.0000 | ORAL_TABLET | Freq: Once | ORAL | Status: AC
  Administered 2014-01-31: 1 via ORAL
  Filled 2014-01-31: qty 1

## 2014-01-31 MED ORDER — CYCLOBENZAPRINE HCL 10 MG PO TABS: 10.0000 mg | ORAL_TABLET | Freq: Two times a day (BID) | ORAL | Status: DC | PRN

## 2014-01-31 MED ORDER — HYDROCODONE-ACETAMINOPHEN 5-325 MG PO TABS: 1.0000 | ORAL_TABLET | ORAL | Status: DC | PRN

## 2014-01-31 NOTE — ED Notes (Signed)
Pt states he woke up this morning with back pain and that it has continued to "get worse" throughout  day. States pain travels to right flank area and down both legs. Pt denies any injury. Pt denies having ever had kidney stones but states he had a little difficulty trying to urinate this morning and did notice some burning with urination.

## 2014-01-31 NOTE — ED Provider Notes (Signed)
CSN: [id number]     Arrival date & time 01/31/14  1922 History   First MD Initiated Contact with Patient 01/31/14 1937     Chief Complaint  Patient presents with  . Back Pain     (Consider location/radiation/quality/duration/timing/severity/associated sxs/prior Treatment) Patient is a 24 y.o. male presenting with back pain. The history is provided by the patient.  Back Pain Location:  Lumbar spine Quality:  Aching Radiates to:  R posterior upper leg Pain severity:  Moderate Onset quality:  Gradual Duration:  8 hours Timing:  Constant Progression:  Worsening Chronicity:  New  Christopher Houston is a 24 y.o. male who presents to the ED with lower back pain that radiates to the right buttock. The pain started when he woke this am and has gotten worse as the day has progressed. Patient states the pain is so bad he thought it may be a kidney stone, but no hx of one. He denies any UTI symptoms.  Past Medical History  Diagnosis Date  . Anxiety   . Depression   . OCD (obsessive compulsive disorder)   . Bipolar 1 disorder    History reviewed. No pertinent past surgical history. Family History  Problem Relation Age of Onset  . Heart attack Father   . Cancer Other   . Diabetes Other    History  Substance Use Topics  . Smoking status: Current Every Day Smoker -- 0.50 packs/day for 10 years    Types: Cigarettes  . Smokeless tobacco: Former Neurosurgeon     Comment: 1/2 pack daily  . Alcohol Use: Yes     Comment: occ    Review of Systems  Musculoskeletal: Positive for back pain.  all other systems negative    Allergies  Clindamycin/lincomycin  Home Medications   Prior to Admission medications   Medication Sig Start Date End Date Taking? Authorizing Provider  cyclobenzaprine (FLEXERIL) 10 MG tablet Take 1 tablet (10 mg total) by mouth 2 (two) times daily as needed for muscle spasms. 01/31/14   Hope Orlene Och, NP  HYDROcodone-acetaminophen (NORCO/VICODIN) 5-325 MG per tablet Take 1 tablet  by mouth every 4 (four) hours as needed. 01/31/14   Hope Orlene Och, NP  naproxen (NAPROSYN) 500 MG tablet Take 1 tablet (500 mg total) by mouth 2 (two) times daily. 01/31/14   Hope Orlene Och, NP  oxyCODONE-acetaminophen (PERCOCET/ROXICET) 5-325 MG per tablet Take 2 tablets by mouth every 4 (four) hours as needed for moderate pain or severe pain. 01/31/14   Hope Orlene Och, NP  predniSONE (DELTASONE) 10 MG tablet Start 02/01/14 01/31/14   Hope Orlene Och, NP   BP 147/83 mmHg  Pulse 96  Temp(Src) 99.3 F (37.4 C) (Oral)  Resp 20  Ht 6' (1.829 m)  Wt 260 lb (117.935 kg)  BMI 35.25 kg/m2  SpO2 99% Physical Exam  Constitutional: He is oriented to person, place, and time. He appears well-developed and well-nourished. No distress.  HENT:  Head: Normocephalic and atraumatic.  Eyes: EOM are normal. Pupils are equal, round, and reactive to light.  Neck: Normal range of motion. Neck supple.  Cardiovascular: Normal rate and regular rhythm.   Pulmonary/Chest: Effort normal. No respiratory distress. He has no wheezes. He has no rales.  Abdominal: Soft. There is no tenderness.  Musculoskeletal: Normal range of motion. He exhibits no edema.       Lumbar back: He exhibits tenderness and spasm. He exhibits normal range of motion, no deformity and normal pulse.  Back:  Pain with palpation over the right sciatic nerve.   Neurological: He is alert and oriented to person, place, and time. He has normal strength. No cranial nerve deficit or sensory deficit. Coordination normal. Abnormal gait: due to pain.  Reflex Scores:      Bicep reflexes are 2+ on the right side and 2+ on the left side.      Brachioradialis reflexes are 2+ on the right side and 2+ on the left side.      Patellar reflexes are 2+ on the right side and 2+ on the left side.      Achilles reflexes are 2+ on the right side and 2+ on the left side. Pedal pulses equal, adequate circulation, good touch sensation, straight leg raises without difficulty but  does complain of pain on the right.   Skin: Skin is warm and dry.  Psychiatric: He has a normal mood and affect. His behavior is normal.  Nursing note and vitals reviewed.   ED Course  Procedures (including critical care time) Labs Review Results for orders placed or performed during the hospital encounter of 01/31/14 (from the past 24 hour(s))  Urinalysis, Routine w reflex microscopic     Status: None   Collection Time: 01/31/14  8:00 PM  Result Value Ref Range   Color, Urine YELLOW YELLOW   APPearance CLEAR CLEAR   Specific Gravity, Urine 1.025 1.005 - 1.030   pH 6.5 5.0 - 8.0   Glucose, UA NEGATIVE NEGATIVE mg/dL   Hgb urine dipstick NEGATIVE NEGATIVE   Bilirubin Urine NEGATIVE NEGATIVE   Ketones, ur NEGATIVE NEGATIVE mg/dL   Protein, ur NEGATIVE NEGATIVE mg/dL   Urobilinogen, UA 0.2 0.0 - 1.0 mg/dL   Nitrite NEGATIVE NEGATIVE   Leukocytes, UA NEGATIVE NEGATIVE    After pain medications, muscle relaxants, prednisone and Toradol the patient's pain is 1/10. He states he can walk and move without severe pain.  MDM  24 y.o. male with right side lumbar pain that has become severe over the course of the day. Stable for discharge without neuro deficits. I discussed with the patient in detail clinical findings and need for follow up with Dr. Gerilyn Pilgrim. Patient agrees with plan. I have reviewed this patient's vital signs, nurses notes and appropriate labs. Although the triage assessment made mention of difficulty with urination the patient states he is having no symptoms.    Final diagnoses:  Sciatica, right       [facility], NP 01/31/14 2256  Vida Roller, MD 01/31/14 [phone]

## 2014-02-07 MED FILL — Oxycodone w/ Acetaminophen Tab 5-325 MG: ORAL | Qty: 6 | Status: AC

## 2014-02-17 IMAGING — CT CT ABD-PELV W/ CM
2 of 4 series · 16 of 46 positions shown, 18 images · IV contrast (Omnipaque 300)
Comparison: CT lumbar spine December 28, 2012

CLINICAL DATA: Right flank pain

EXAM:
CT ABDOMEN AND PELVIS WITH CONTRAST
TECHNIQUE: Multidetector CT imaging of the abdomen and pelvis was performed
using the standard protocol following bolus administration of
intravenous contrast.
CONTRAST:  50mL OMNIPAQUE IOHEXOL 300 MG/ML SOLN, 100mL OMNIPAQUE
IOHEXOL 300 MG/ML SOLN

[Series 2: abd_pel_with 5.0 b40f · axial · 0.87mm/px · z∈[-482,-12]mm · 13 of 104 slices shown, 15 images]
[im 5/104  soft-tissue]
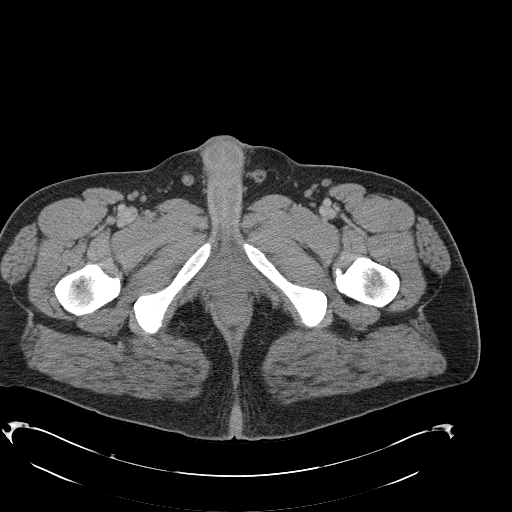
[im 5/104  bone]
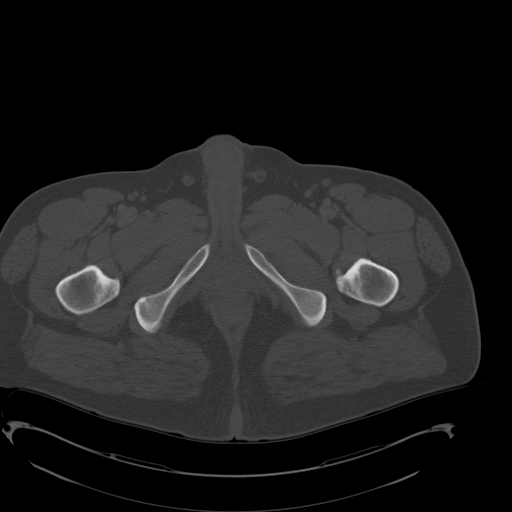
[im 13/104  soft-tissue]
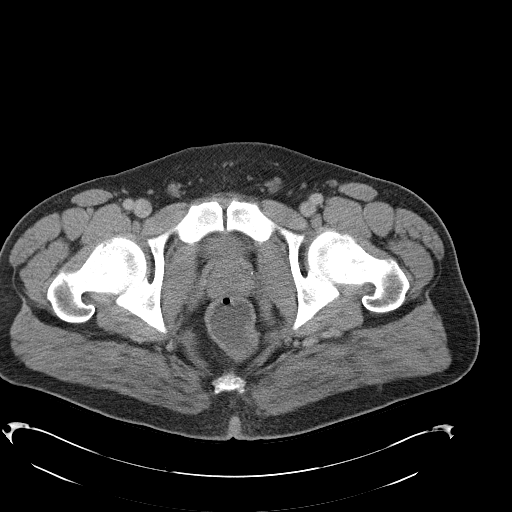
[im 21/104  soft-tissue]
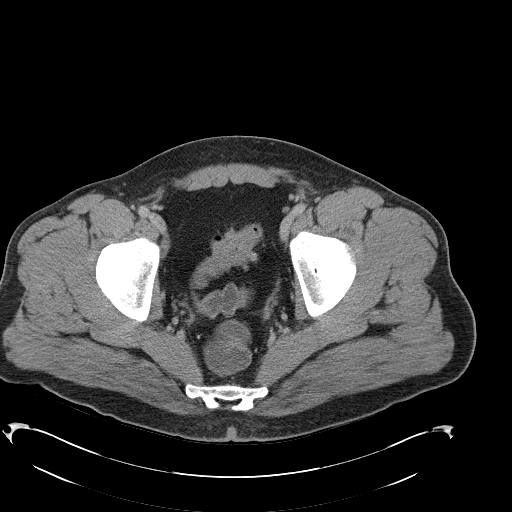
[im 29/104  soft-tissue]
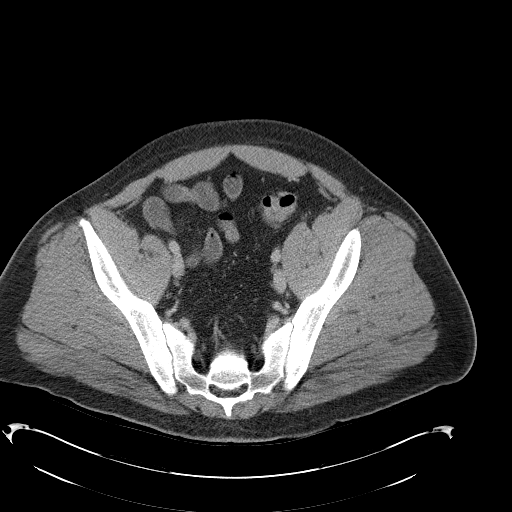
[im 38/104  soft-tissue]
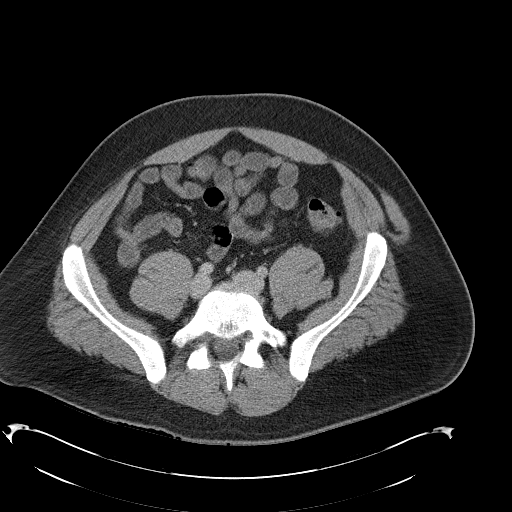
[im 46/104  soft-tissue]
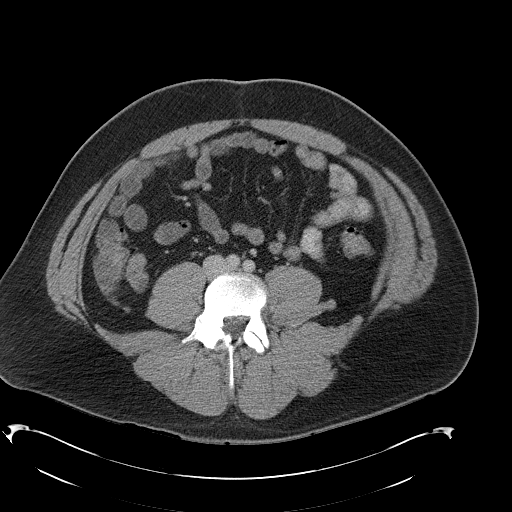
[im 54/104  soft-tissue]
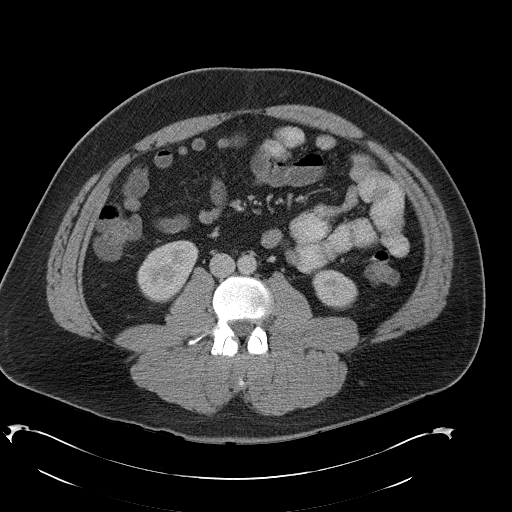
[im 58/104  soft-tissue]
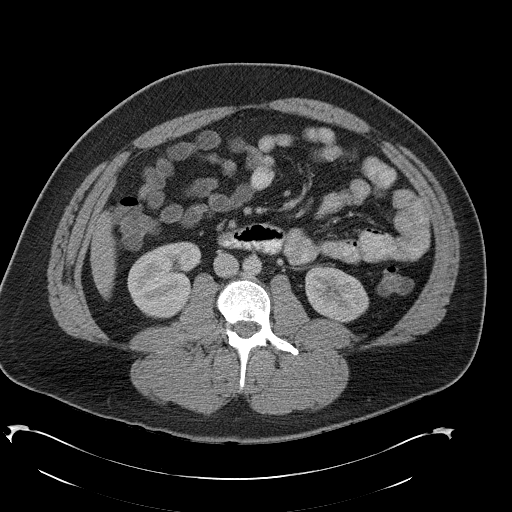
[im 66/104  soft-tissue]
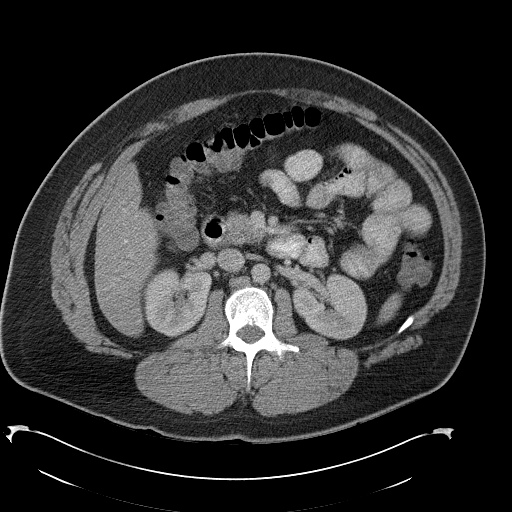
[im 66/104  bone]
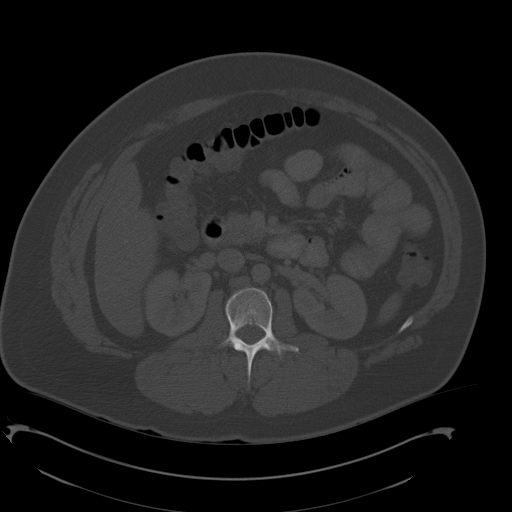
[im 75/104  soft-tissue]
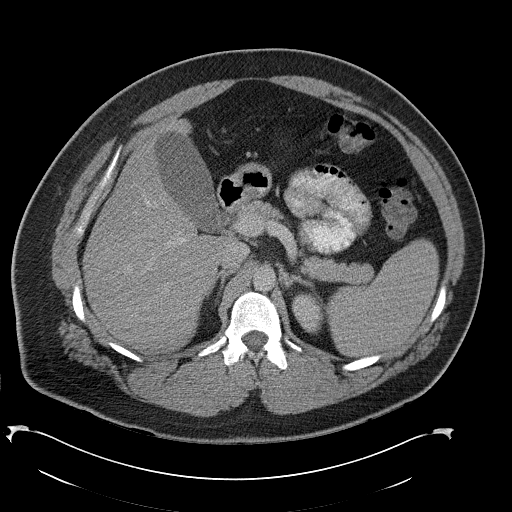
[im 83/104  soft-tissue]
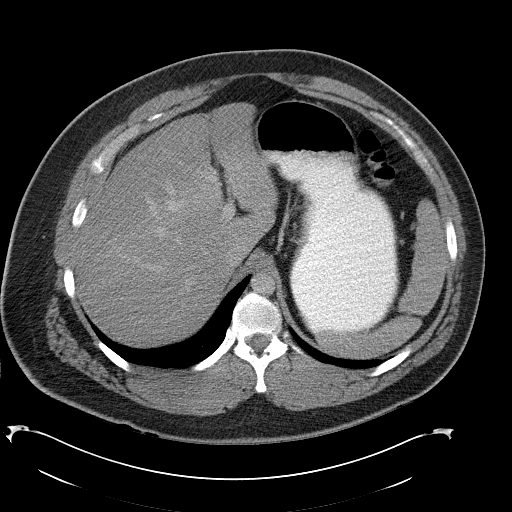
[im 91/104  soft-tissue]
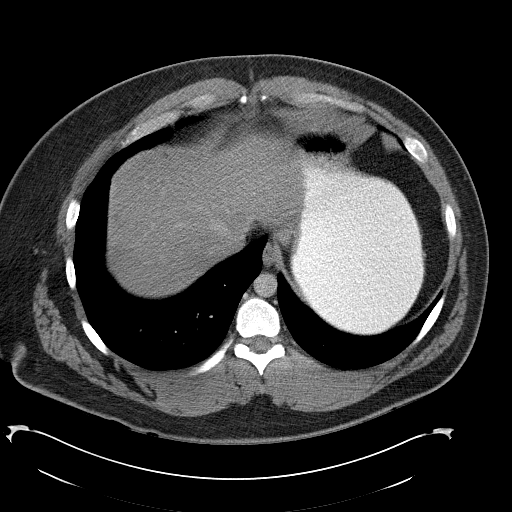
[im 99/104  soft-tissue]
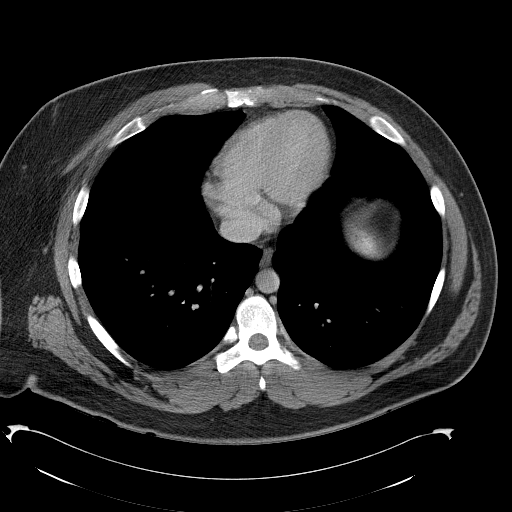

[Series 4: abd_pel_with 3.0 spo · coronal · 0.79mm/px · 3 of 102 slices shown]
[im 34/102  soft-tissue]
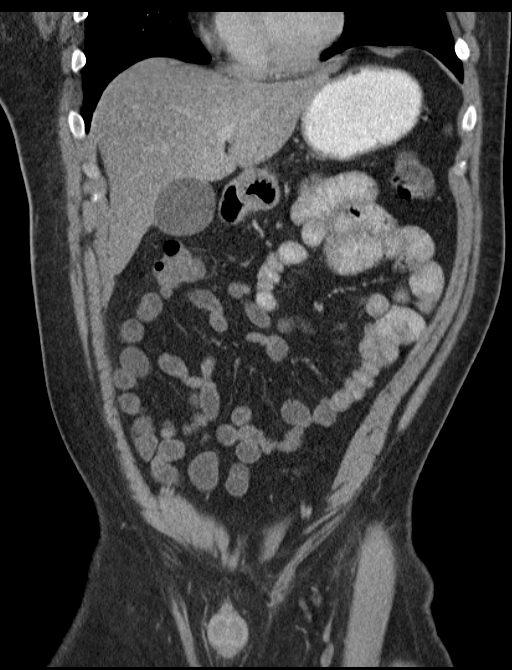
[im 45/102  soft-tissue]
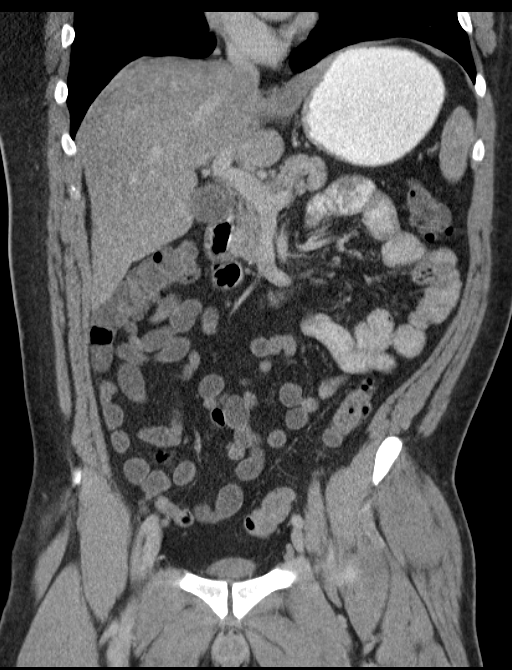
[im 57/102  soft-tissue]
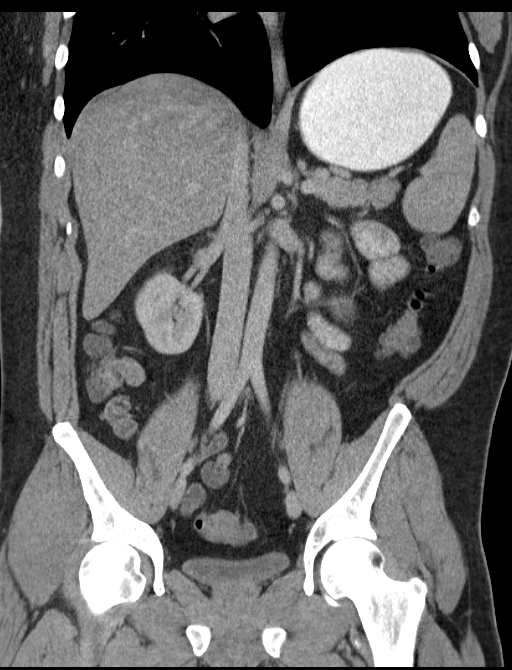

[16 of 46 positions shown; findings below may reference images not displayed]

FINDINGS: There is diffuse fatty infiltration of liver. The liver is otherwise
normal. The spleen, pancreas, gallbladder, adrenal glands and
kidneys are normal. The aorta is normal. There is no abdominal
lymphadenopathy. There is no small bowel obstruction or
diverticulitis. There is diverticulosis of colon. The appendix is
normal.

Partial fluid-filled bladder is normal. The visualized lung bases
are clear. Chronic pars defects of L5 is identified unchanged
compared to previous CT.
IMPRESSION: No acute abnormality identified in the abdomen and pelvis. No
hydronephrosis bilaterally. The appendix is normal.

## 2015-01-18 ENCOUNTER — Encounter (HOSPITAL_COMMUNITY): Payer: Self-pay

## 2015-01-18 ENCOUNTER — Emergency Department (HOSPITAL_COMMUNITY): Payer: Medicare Other

## 2015-01-18 ENCOUNTER — Emergency Department (HOSPITAL_COMMUNITY)
Admission: EM | Admit: 2015-01-18 | Discharge: 2015-01-18 | Disposition: A | Discharge status: Home / Self Care | Payer: Medicare Other | Attending: Emergency Medicine | Admitting: Emergency Medicine

## 2015-01-18 DIAGNOSIS — S4991XA Unspecified injury of right shoulder and upper arm, initial encounter: Secondary | ICD-10-CM | POA: Diagnosis present

## 2015-01-18 DIAGNOSIS — R55 Syncope and collapse: Secondary | ICD-10-CM | POA: Diagnosis not present

## 2015-01-18 DIAGNOSIS — R000 Tachycardia, unspecified: Secondary | ICD-10-CM | POA: Diagnosis not present

## 2015-01-18 DIAGNOSIS — Y9289 Other specified places as the place of occurrence of the external cause: Secondary | ICD-10-CM | POA: Diagnosis not present

## 2015-01-18 DIAGNOSIS — Z8659 Personal history of other mental and behavioral disorders: Secondary | ICD-10-CM | POA: Insufficient documentation

## 2015-01-18 DIAGNOSIS — Y999 Unspecified external cause status: Secondary | ICD-10-CM | POA: Insufficient documentation

## 2015-01-18 DIAGNOSIS — W1839XA Other fall on same level, initial encounter: Secondary | ICD-10-CM | POA: Insufficient documentation

## 2015-01-18 DIAGNOSIS — S43004A Unspecified dislocation of right shoulder joint, initial encounter: Secondary | ICD-10-CM | POA: Diagnosis not present

## 2015-01-18 DIAGNOSIS — F17210 Nicotine dependence, cigarettes, uncomplicated: Secondary | ICD-10-CM | POA: Insufficient documentation

## 2015-01-18 DIAGNOSIS — Y9389 Activity, other specified: Secondary | ICD-10-CM | POA: Insufficient documentation

## 2015-01-18 MED ORDER — OXYCODONE-ACETAMINOPHEN 5-325 MG PO TABS: 1.0000 | ORAL_TABLET | Freq: Four times a day (QID) | ORAL | Status: DC | PRN

## 2015-01-18 MED ORDER — HYDROMORPHONE HCL 1 MG/ML IJ SOLN
1.0000 mg | Freq: Once | INTRAMUSCULAR | Status: AC
  Administered 2015-01-18: 1 mg via INTRAMUSCULAR
  Filled 2015-01-18: qty 1

## 2015-01-18 NOTE — Discharge Instructions (Signed)
Shoulder Dislocation °A shoulder dislocation happens when the upper arm bone (humerus) moves out of the shoulder joint. The shoulder joint is the part of the shoulder where the humerus, shoulder blade (scapula), and collarbone (clavicle) meet. °CAUSES °This condition is often caused by: °· A fall. °· A hit to the shoulder. °· A forceful movement of the shoulder. °RISK FACTORS °This condition is more likely to develop in people who play sports. °SYMPTOMS °Symptoms of this condition include: °· Deformity of the shoulder. °· Intense pain. °· Inability to move the shoulder. °· Numbness, weakness, or tingling in your neck or down your arm. °· Bruising or swelling around your shoulder. °DIAGNOSIS °This condition is diagnosed with a physical exam. After the exam, tests may be done to check for related problems. Tests that may be done include: °· X-ray. This may be done to check for broken bones. °· MRI. This may be done to check for damage to the tissues around the shoulder. °· Electromyogram. This may be done to check for nerve damage. °TREATMENT °This condition is treated with a procedure to place the humerus back in the joint. This procedure is called a reduction. There are two types of reduction: °· Closed reduction. In this procedure, the humerus is placed back in the joint without surgery. The health care provider uses his or her hands to guide the bone back into place. °· Open reduction. In this procedure, the humerus is placed back in the joint with surgery. An open reduction may be recommended if: °¨ You have a weak shoulder joint or weak ligaments. °¨ You have had more than one shoulder dislocation. °¨ The nerves or blood vessels around your shoulder have been damaged. °After the humerus is placed back into the joint, your arm will be placed in a splint or sling to prevent it from moving. You will need to wear the splint or sling until your shoulder heals. When the splint or sling is removed, you may have  physical therapy to help improve the range of motion in your shoulder joint. °HOME CARE INSTRUCTIONS °If You Have a Splint or Sling: °· Wear it as told by your health care provider. Remove it only as told by your health care provider. °· Loosen it if your fingers become numb and tingle, or if they turn cold and blue. °· Keep it clean and dry. °Bathing °· Do not take baths, swim, or use a hot tub until your health care provider approves. Ask your health care provider if you can take showers. You may only be allowed to take sponge baths for bathing. °· If your health care provider approves bathing and showering, cover your splint or sling with a watertight plastic bag to protect it from water. Do not let the splint or sling get wet. °Managing Pain, Stiffness, and Swelling °· If directed, apply ice to the injured area. °¨ Put ice in a plastic bag. °¨ Place a towel between your skin and the bag. °¨ Leave the ice on for 20 minutes, 2-3 times per day. °· Move your fingers often to avoid stiffness and to decrease swelling. °· Raise (elevate) the injured area above the level of your heart while you are sitting or lying down. °Driving °· Do not drive while wearing a splint or sling on a hand that you use for driving. °· Do not drive or operate heavy machinery while taking pain medicine. °Activity °· Return to your normal activities as told by your health care provider. Ask your   health care provider what activities are safe for you. °· Perform range-of-motion exercises only as told by your health care provider. °· Exercise your hand by squeezing a soft ball. This helps to decrease stiffness and swelling in your hand and wrist. °General Instructions °· Take over-the-counter and prescription medicines only as told by your health care provider. °· Do not use any tobacco products, including cigarettes, chewing tobacco, or e-cigarettes. Tobacco can delay bone and tissue healing. If you need help quitting, ask your health care  provider. °· Keep all follow-up visits as told by your health care provider. This is important. °SEEK MEDICAL CARE IF: °· Your splint or sling gets damaged. °SEEK IMMEDIATE MEDICAL CARE IF: °· Your pain gets worse rather than better. °· You lose feeling in your arm or hand. °· Your arm or hand becomes white and cold. °  °This information is not intended to replace advice given to you by your health care provider. Make sure you discuss any questions you have with your health care provider. °  °Document Released: 10/09/2000 Document Revised: 10/05/2014 Document Reviewed: 05/09/2014 °Elsevier Interactive Patient Education ©2016 [employer] ° °

## 2015-01-18 NOTE — ED Notes (Signed)
Pt reports has history of shoulder dislocation.  Reports was playing with his dog today and thinks he dislocated it again.  Pt says usually can put his shoulder back in but says this time when he did, he passed out and fell on bed.  Wife says pt was unconscious for appros 35-45 sec.  Pt says still having severe pain in r shoulder.

## 2015-01-18 NOTE — ED Provider Notes (Signed)
CSN: [id number]     Arrival date & time 01/18/15  1538 History   First MD Initiated Contact with Patient 01/18/15 1559     Chief Complaint  Patient presents with  . Shoulder Pain  . Loss of Consciousness     (Consider location/radiation/quality/duration/timing/severity/associated sxs/prior Treatment) Patient is a 24 y.o. male presenting with shoulder pain and syncope. The history is provided by the patient.  Shoulder Pain Loss of Consciousness Associated symptoms: no shortness of breath and no weakness    patient's pain in his right shoulder. Has previous history of frequent dislocations. Has seen Ortho in the past was not inserted. States today he was playing total or with his dog and when he lifted the shoulder up and felt as if it popped backwards and he was having difficulty moving it. He's had tingling down the hand and arm 2. No real weakness now. States he felt a pop back in there is still something in the way. States he passed out with the pain. No headache. Some tingling in the hand but no weakness.  Past Medical History  Diagnosis Date  . Anxiety   . Depression   . OCD (obsessive compulsive disorder)   . Bipolar 1 disorder (HCC)    History reviewed. No pertinent past surgical history. Family History  Problem Relation Age of Onset  . Heart attack Father   . Cancer Other   . Diabetes Other    Social History  Substance Use Topics  . Smoking status: Current Every Day Smoker -- 0.50 packs/day for 10 years    Types: Cigarettes  . Smokeless tobacco: Former Neurosurgeon     Comment: 1/2 pack daily  . Alcohol Use: Yes     Comment: occ    Review of Systems  Constitutional: Negative for chills.  Respiratory: Negative for shortness of breath.   Cardiovascular: Positive for syncope. Negative for leg swelling.  Gastrointestinal: Negative for abdominal pain.  Musculoskeletal:       Right shoulder pain  Skin: Negative for wound.  Neurological: Positive for numbness. Negative for  weakness and light-headedness.      Allergies  Clindamycin/lincomycin  Home Medications   Prior to Admission medications   Medication Sig Start Date End Date Taking? Authorizing Provider  oxyCODONE-acetaminophen (PERCOCET/ROXICET) 5-325 MG tablet Take 1-2 tablets by mouth every 6 (six) hours as needed. 01/18/15   Benjiman Core, MD   BP 129/103 mmHg  Pulse 127  Temp(Src) 98.3 F (36.8 C) (Oral)  Resp 20  Ht 6' (1.829 m)  Wt 265 lb (120.203 kg)  BMI 35.93 kg/m2  SpO2 99% Physical Exam  Constitutional: He appears well-developed.  HENT:  Head: Atraumatic.  Cardiovascular:  Tachycardia  Pulmonary/Chest: Effort normal. He has no wheezes.  Abdominal: Soft. There is no tenderness.  Musculoskeletal: He exhibits tenderness.  Tenderness over right shoulder. No deformity. Neurovascularly intact in right hand. No tenderness over elbow. Strong radial pulse. Good sensation in radial median and ulnar disposition hands. Good strength in radial median and ulnar distribution.  Neurological: He is alert.  Skin: Skin is warm.    ED Course  Procedures (including critical care time) Labs Review Labs Reviewed - No data to display  Imaging Review Dg Shoulder Right  01/18/2015  CLINICAL DATA:  Right shoulder pain, recent injury, popping sensation. EXAM: RIGHT SHOULDER - 2+ VIEW COMPARISON:  12/28/2012 FINDINGS: Normal alignment without acute fracture. Minimal sclerosis and irregularity of the glenoid from a remote glenoid fracture in 2014. AC joint aligned. No  subluxation or dislocation. Visualize right lung clear. Right ribs intact. IMPRESSION: No acute osseous finding. Electronically Signed   By: Judie PetitM.  Shick M.D.   On: 01/18/2015 16:24   I have personally reviewed and evaluated these images and lab results as part of my medical decision-making.   EKG Interpretation None      MDM   Final diagnoses:  Shoulder dislocation, right, initial encounter    Patient with right shoulder  pain. Likely related to a dislocation. Has had the same in the past. X-ray reassuring. Will give shoulder immobilizer and have follow-up with or so. Has seen Dr. Hilda LiasKeeling in the past but someone else.    Benjiman CoreNathan Tajha Sammarco, MD 01/18/15 [phone]

## 2015-01-19 ENCOUNTER — Telehealth: Payer: Self-pay | Admitting: Orthopedic Surgery

## 2015-01-19 NOTE — Telephone Encounter (Signed)
Sp[facility] with patient regarding [facility] for shoulder dislocation; discussed appointment, next available; patient scheduled.

## 2015-01-31 ENCOUNTER — Ambulatory Visit (INDEPENDENT_AMBULATORY_CARE_PROVIDER_SITE_OTHER): Payer: Medicare Other | Admitting: Orthopedic Surgery

## 2015-01-31 VITALS — Ht 72.0 in | Wt 265.0 lb

## 2015-01-31 DIAGNOSIS — M25311 Other instability, right shoulder: Secondary | ICD-10-CM | POA: Diagnosis not present

## 2015-01-31 NOTE — Progress Notes (Signed)
Patient ID: Christopher Houston, male   DOB: [birth year], 25 y.o.   MRN: [id number]  Chief Complaint  Patient presents with  . Shoulder Injury    er follow up Rt shoulder dislocation,     HPI Christopher Houston is a 25 y.o. male.  25 year old male has had a history of 18 dislocations never had treatment. He also had a fracture after he had about 8 dislocations.  Presented to the ER with questionable recurrent dislocation and subluxation with relocation. X-rays were negative for dislocation patient thought to have reduced it himself. Patient did felt acute pain numbness and actually passed out when the dislocation occurred as he couldn't initially maneuver the shoulder back into place.  However by the time he got to the ER the shoulder was back  He's never had any treatment such as therapy or surgery, never had an MRI on the right shoulder  Review of systems not currently having numbness was still having some pain in the chest wall and into the arm. No fever chills.  Review of Systems Review of Systems As noted  Past Medical History  Diagnosis Date  . Anxiety   . Depression   . OCD (obsessive compulsive disorder)   . Bipolar 1 disorder (HCC)     No past surgical history on file.  Family History  Problem Relation Age of Onset  . Heart attack Father   . Cancer Other   . Diabetes Other     Social History Social History  Substance Use Topics  . Smoking status: Current Every Day Smoker -- 0.50 packs/day for 10 years    Types: Cigarettes  . Smokeless tobacco: Former NeurosurgeonUser     Comment: 1/2 pack daily  . Alcohol Use: Yes     Comment: occ    Allergies  Allergen Reactions  . Clindamycin/Lincomycin Other (See Comments)    Hot, sweaty and shaky    Current Outpatient Prescriptions  Medication Sig Dispense Refill  . oxyCODONE-acetaminophen (PERCOCET/ROXICET) 5-325 MG tablet Take 1-2 tablets by mouth every 6 (six) hours as needed. 12 tablet 0   No current facility-administered  medications for this visit.       Physical Exam Physical Exam Height 6' (1.829 m), weight 265 lb (120.203 kg). Appearance, there are no abnormalities in terms of appearance the patient was well-developed and well-nourished. The grooming and hygiene were normal.  Mental status orientation, there was normal alertness and orientation Mood pleasant Ambulatory status normal with no assistive devices  Examination of the right shoulder Inspection tenderness globally throughout the shoulder Range of motion not assessed secondary to pain Tests for stability not assessed secondary to pain Motor strength  no muscle atrophy, elbow wrist and hand motor function normal Skin warm dry and intact without laceration or ulceration or erythema Neurologic examination normal sensation including lateral deltoid Vascular examination normal pulses with warm extremity and normal capillary refill  The opposite extremity normal range of motion    Data Reviewed My interpretation of the x-ray confirmed reduction without subluxation dislocation or fracture  Assessment  Recurrent dislocations right shoulder   Plan  Sling 4 weeks total, return in a couple of weeks to start physical therapy. Patient probably will need referral eventually to a shoulder specialist and further imaging to assess glenoid version.

## 2015-02-16 ENCOUNTER — Encounter: Payer: Self-pay | Admitting: *Deleted

## 2015-02-16 ENCOUNTER — Ambulatory Visit: Payer: Medicare Other | Admitting: Orthopedic Surgery

## 2015-05-17 ENCOUNTER — Emergency Department (HOSPITAL_COMMUNITY)
Admission: EM | Admit: 2015-05-17 | Discharge: 2015-05-17 | Disposition: A | Discharge status: Home / Self Care | Payer: Medicare Other | Attending: Emergency Medicine | Admitting: Emergency Medicine

## 2015-05-17 ENCOUNTER — Encounter (HOSPITAL_COMMUNITY): Payer: Self-pay | Admitting: Emergency Medicine

## 2015-05-17 DIAGNOSIS — R195 Other fecal abnormalities: Secondary | ICD-10-CM | POA: Insufficient documentation

## 2015-05-17 DIAGNOSIS — F329 Major depressive disorder, single episode, unspecified: Secondary | ICD-10-CM | POA: Diagnosis not present

## 2015-05-17 DIAGNOSIS — F17210 Nicotine dependence, cigarettes, uncomplicated: Secondary | ICD-10-CM | POA: Diagnosis not present

## 2015-05-17 DIAGNOSIS — H66001 Acute suppurative otitis media without spontaneous rupture of ear drum, right ear: Secondary | ICD-10-CM

## 2015-05-17 DIAGNOSIS — K0889 Other specified disorders of teeth and supporting structures: Secondary | ICD-10-CM | POA: Diagnosis not present

## 2015-05-17 DIAGNOSIS — K5900 Constipation, unspecified: Secondary | ICD-10-CM | POA: Diagnosis not present

## 2015-05-17 DIAGNOSIS — H9201 Otalgia, right ear: Secondary | ICD-10-CM | POA: Diagnosis present

## 2015-05-17 LAB — POC OCCULT BLOOD, ED: Fecal Occult Bld: POSITIVE — AB

## 2015-05-17 MED ORDER — AMOXICILLIN 500 MG PO CAPS: 500.0000 mg | ORAL_CAPSULE | Freq: Three times a day (TID) | ORAL | Status: AC

## 2015-05-17 NOTE — ED Provider Notes (Signed)
CSN: [id number]     Arrival date & time 05/17/15  1937 History  By signing my name below, I, Christopher Houston, attest that this documentation has been prepared under the direction and in the presence of No att. providers found. Electronically Signed: Linus Houston, ED Scribe. 05/18/2015. 10:08 PM.  Chief Complaint  Patient presents with  . Dental Pain   The history is provided by the patient. No language interpreter was used.   HPI Comments: Christopher Houston is a 25 y.o. male who presents to the Emergency Department complaining of dental pain that began 4 days ago. Pt also reports 3 days of right ear pain with ringing, pressure and decreased hearing. Today the pt noted lower abdominal pain, trouble moving his bowels and bloody stools. He states he has a hx of constipation intermittently and takes a laxative. Pt took a "chocalate exlax" last night with no relief. He also took [employer] powder for feeling feverish with chills and ibuprofen for pain with no relief. Pt denies any dizziness, N/V/D or any other symptoms at this time.   Pt has not seen a dentist in 3 years.   Past Medical History  Diagnosis Date  . Anxiety   . Depression   . OCD (obsessive compulsive disorder)   . Bipolar 1 disorder (HCC)    History reviewed. No pertinent past surgical history. Family History  Problem Relation Age of Onset  . Heart attack Father   . Cancer Other   . Diabetes Other    Social History  Substance Use Topics  . Smoking status: Current Every Day Smoker -- 0.50 packs/day for 10 years    Types: Cigarettes  . Smokeless tobacco: Former Neurosurgeon     Comment: 1/2 pack daily  . Alcohol Use: No     Comment: occ    Review of Systems  All other systems reviewed and are negative.  Allergies  Clindamycin/lincomycin  Home Medications   Prior to Admission medications   Medication Sig Start Date End Date Taking? Authorizing Provider  amoxicillin (AMOXIL) 500 MG capsule Take 1 capsule (500 mg total) by  mouth 3 (three) times daily. 05/17/15   Mancel Bale, MD   BP 183/119 mmHg  Pulse 92  Temp(Src) 98.7 F (37.1 C)  Resp 18  Ht 6' (1.829 m)  Wt 264 lb (119.75 kg)  BMI 35.80 kg/m2  SpO2 100%   Physical Exam  Constitutional: He is oriented to person, place, and time. He appears well-developed and well-nourished.  HENT:  Head: Normocephalic and atraumatic.  Right Ear: External ear normal. Tympanic membrane is erythematous and retracted.  Left Ear: Tympanic membrane and external ear normal.  Ears:  Mouth/Throat: Uvula is midline, oropharynx is clear and moist and mucous membranes are normal. No trismus in the jaw. No dental abscesses.    Eyes: Conjunctivae and EOM are normal. Pupils are equal, round, and reactive to light.  Neck: Normal range of motion and phonation normal. Neck supple.  Cardiovascular: Normal rate, regular rhythm and normal heart sounds.   Pulmonary/Chest: Effort normal and breath sounds normal. He exhibits no bony tenderness.  Abdominal: Soft. There is no tenderness.  Musculoskeletal: Normal range of motion.  Neurological: He is alert and oriented to person, place, and time. No cranial nerve deficit or sensory deficit. He exhibits normal muscle tone. Coordination normal.  Skin: Skin is warm, dry and intact.  Psychiatric: He has a normal mood and affect. His behavior is normal. Judgment and thought content normal.  Nursing  note and vitals reviewed.   ED Course  Procedures   DIAGNOSTIC STUDIES: Oxygen Saturation is 98% on room air, normal by my interpretation.    COORDINATION OF CARE: 9:58 PM Discussed treatment plan with pt at bedside and pt agreed to plan.   Labs Review Labs Reviewed  POC OCCULT BLOOD, ED - Abnormal; Notable for the following:    Fecal Occult Bld POSITIVE (*)    All other components within normal limits   Imaging Review No results found. I have personally reviewed and evaluated these images and lab results as part of my medical  decision-making.   EKG Interpretation None      MDM   Final diagnoses:  Acute suppurative otitis media of right ear without spontaneous rupture of tympanic membrane, recurrence not specified  Pain, dental  Constipation, unspecified constipation type  Heme positive stool    Right otitis media, without complicating features. Also constipation with mild rectal bleeding. Abdominal discomfort is nonspecific. Doubt colitis or impending vascular collapse. Incidental high blood pressure. Doubt end organ damage.  Nursing Notes Reviewed/ Care Coordinated Applicable Imaging Reviewed Interpretation of Laboratory Data incorporated into ED treatment  The patient appears reasonably screened and/or stabilized for discharge and I doubt any other medical condition or other [facility] requiring further screening, evaluation, or treatment in the ED at this time prior to discharge.  Plan: Home Medications- Amoxil; Home Treatments- rest,stool softener, fiber/fluids; return here if the recommended treatment, does not improve the symptoms; Recommended follow up- PCP f/u for further treatment   I personally performed the services described in this documentation, which was scribed in my presence. The recorded information has been reviewed and is accurate.       Mancel BaleElliott Brigido Mera, MD 05/22/15 1310

## 2015-05-17 NOTE — ED Notes (Signed)
Instructed pt on importance of f/u with PCP concerning HTN as well

## 2015-05-17 NOTE — ED Notes (Signed)
Pt c/o dental pain x 4days and ear pain with ringing for a few day. Pt also c/o abd pain.

## 2015-05-17 NOTE — ED Notes (Signed)
Talked briefly with pt about HTN and ways to help such as exercise, limit salt intake, and increasing fresh fruits and vegetables which will help with fiber and constipation

## 2015-05-17 NOTE — Discharge Instructions (Signed)
Take the antibiotic for the ear infection as directed. Use ibuprofen or acetaminophen for pain. Use Colace (docusate sodium), 100 mg twice a day for 1 month, as a stool softener. If this does not help, use MiraLAX twice a day instead. This should improve the blood and mucus that you saw today.   Constipation, Adult Constipation is when a person has fewer than three bowel movements a week, has difficulty having a bowel movement, or has stools that are dry, hard, or larger than normal. As people grow older, constipation is more common. A low-fiber diet, not taking in enough fluids, and taking certain medicines may make constipation worse.  CAUSES   Certain medicines, such as antidepressants, pain medicine, iron supplements, antacids, and water pills.   Certain diseases, such as diabetes, irritable bowel syndrome (IBS), thyroid disease, or depression.   Not drinking enough water.   Not eating enough fiber-rich foods.   Stress or travel.   Lack of physical activity or exercise.   Ignoring the urge to have a bowel movement.   Using laxatives too much.  SIGNS AND SYMPTOMS  1. Having fewer than three bowel movements a week.  2. Straining to have a bowel movement.  3. Having stools that are hard, dry, or larger than normal.  4. Feeling full or bloated.  5. Pain in the lower abdomen.  6. Not feeling relief after having a bowel movement.  DIAGNOSIS  Your health care provider will take a medical history and perform a physical exam. Further testing may be done for severe constipation. Some tests may include:  A barium enema X-ray to examine your rectum, colon, and, sometimes, your small intestine.   A sigmoidoscopy to examine your lower colon.   A colonoscopy to examine your entire colon. TREATMENT  Treatment will depend on the severity of your constipation and what is causing it. Some dietary treatments include drinking more fluids and eating more fiber-rich foods.  Lifestyle treatments may include regular exercise. If these diet and lifestyle recommendations do not help, your health care provider may recommend taking over-the-counter laxative medicines to help you have bowel movements. Prescription medicines may be prescribed if over-the-counter medicines do not work.  HOME CARE INSTRUCTIONS   Eat foods that have a lot of fiber, such as fruits, vegetables, whole grains, and beans.  Limit foods high in fat and processed sugars, such as french fries, hamburgers, cookies, candies, and soda.   A fiber supplement may be added to your diet if you cannot get enough fiber from foods.   Drink enough fluids to keep your urine clear or pale yellow.   Exercise regularly or as directed by your health care provider.   Go to the restroom when you have the urge to go. Do not hold it.   Only take over-the-counter or prescription medicines as directed by your health care provider. Do not take other medicines for constipation without talking to your health care provider first.  SEEK IMMEDIATE MEDICAL CARE IF:   You have bright red blood in your stool.   Your constipation lasts for more than 4 days or gets worse.   You have abdominal or rectal pain.   You have thin, pencil-like stools.   You have unexplained weight loss. MAKE SURE YOU:   Understand these instructions.  Will watch your condition.  Will get help right away if you are not doing well or get worse.   This information is not intended to replace advice given to you by your  health care provider. Make sure you discuss any questions you have with your health care provider.   Document Released: 10/13/2003 Document Revised: 02/04/2014 Document Reviewed: 10/26/2012 Elsevier Interactive Patient Education 2016 Elsevier Inc.  High-Fiber Diet Fiber, also called dietary fiber, is a type of carbohydrate found in fruits, vegetables, whole grains, and beans. A high-fiber diet can have many health  benefits. Your health care provider may recommend a high-fiber diet to help:  Prevent constipation. Fiber can make your bowel movements more regular.  Lower your cholesterol.  Relieve hemorrhoids, uncomplicated diverticulosis, or irritable bowel syndrome.  Prevent overeating as part of a weight-loss plan.  Prevent heart disease, type 2 diabetes, and certain cancers. WHAT IS MY PLAN? The recommended daily intake of fiber includes: 7. 38 grams for men under age 26. 8. 30 grams for men over age 26. 249. 25 grams for women under age 26. 6410. 21 grams for women over age 26. You can get the recommended daily intake of dietary fiber by eating a variety of fruits, vegetables, grains, and beans. Your health care provider may also recommend a fiber supplement if it is not possible to get enough fiber through your diet. WHAT DO I NEED TO KNOW ABOUT A HIGH-FIBER DIET?  Fiber supplements have not been widely studied for their effectiveness, so it is better to get fiber through food sources.  Always check the fiber content on thenutrition facts label of any prepackaged food. Look for foods that contain at least 5 grams of fiber per serving.  Ask your dietitian if you have questions about specific foods that are related to your condition, especially if those foods are not listed in the following section.  Increase your daily fiber consumption gradually. Increasing your intake of dietary fiber too quickly may cause bloating, cramping, or gas.  Drink plenty of water. Water helps you to digest fiber. WHAT FOODS CAN I EAT? Grains Whole-grain breads. Multigrain cereal. Oats and oatmeal. Brown rice. Barley. Bulgur wheat. Millet. Bran muffins. Popcorn. Rye wafer crackers. Vegetables Sweet potatoes. Spinach. Kale. Artichokes. Cabbage. Broccoli. Green peas. Carrots. Squash. Fruits Berries. Pears. Apples. Oranges. Avocados. Prunes and raisins. Dried figs. Meats and Other Protein Sources [place], kidney,  pinto, and soy beans. Split peas. Lentils. Nuts and seeds. Dairy Fiber-fortified yogurt. Beverages Fiber-fortified soy milk. Fiber-fortified orange juice. Other Fiber bars. The items listed above may not be a complete list of recommended foods or beverages. Contact your dietitian for more options. WHAT FOODS ARE NOT RECOMMENDED? Grains White bread. Pasta made with refined flour. White rice. Vegetables Fried potatoes. Canned vegetables. Well-cooked vegetables.  Fruits Fruit juice. Cooked, strained fruit. Meats and Other Protein Sources Fatty cuts of meat. Fried Environmental education officerpoultry or fried fish. Dairy Milk. Yogurt. Cream cheese. Sour cream. Beverages Soft drinks. Other Cakes and pastries. Butter and oils. The items listed above may not be a complete list of foods and beverages to avoid. Contact your dietitian for more information. WHAT ARE SOME TIPS FOR INCLUDING HIGH-FIBER FOODS IN MY DIET?  Eat a wide variety of high-fiber foods.  Make sure that half of all grains consumed each day are whole grains.  Replace breads and cereals made from refined flour or white flour with whole-grain breads and cereals.  Replace white rice with brown rice, bulgur wheat, or millet.  Start the day with a breakfast that is high in fiber, such as a cereal that contains at least 5 grams of fiber per serving.  Use beans in place of meat in soups, salads,  or pasta.  Eat high-fiber snacks, such as berries, raw vegetables, nuts, or popcorn.   This information is not intended to replace advice given to you by your health care provider. Make sure you discuss any questions you have with your health care provider.   Document Released: 01/14/2005 Document Revised: 02/04/2014 Document Reviewed: 06/29/2013 Elsevier Interactive Patient Education 2016 Elsevier Inc.  Otitis Media, Adult Otitis media is redness, soreness, and inflammation of the middle ear. Otitis media may be caused by allergies or, most commonly, by  infection. Often it occurs as a complication of the common cold. SIGNS AND SYMPTOMS Symptoms of otitis media may include:  Earache.  Fever.  Ringing in your ear.  Headache.  Leakage of fluid from the ear. DIAGNOSIS To diagnose otitis media, your health care provider will examine your ear with an otoscope. This is an instrument that allows your health care provider to see into your ear in order to examine your eardrum. Your health care provider also will ask you questions about your symptoms. TREATMENT  Typically, otitis media resolves on its own within 3-5 days. Your health care provider may prescribe medicine to ease your symptoms of pain. If otitis media does not resolve within 5 days or is recurrent, your health care provider may prescribe antibiotic medicines if he or she suspects that a bacterial infection is the cause. HOME CARE INSTRUCTIONS  11. If you were prescribed an antibiotic medicine, finish it all even if you start to feel better. 12. Take medicines only as directed by your health care provider. 13. Keep all follow-up visits as directed by your health care provider. SEEK MEDICAL CARE IF:  You have otitis media only in one ear, or bleeding from your nose, or both.  You notice a lump on your neck.  You are not getting better in 3-5 days.  You feel worse instead of better. SEEK IMMEDIATE MEDICAL CARE IF:   You have pain that is not controlled with medicine.  You have swelling, redness, or pain around your ear or stiffness in your neck.  You notice that part of your face is paralyzed.  You notice that the bone behind your ear (mastoid) is tender when you touch it. MAKE SURE YOU:   Understand these instructions.  Will watch your condition.  Will get help right away if you are not doing well or get worse.   This information is not intended to replace advice given to you by your health care provider. Make sure you discuss any questions you have with your health  care provider.   Document Released: 10/20/2003 Document Revised: 02/04/2014 Document Reviewed: 08/11/2012 Elsevier Interactive Patient Education 2016 Elsevier Inc.  Stool for Occult Blood Test WHY AM I HAVING THIS TEST? Stool for occult blood, or fecal occult blood test (FOBT), is a test that is used to screen for gastrointestinal (GI) bleeding, which may be an indicator of colon cancer. This test can also detect small amounts of blood in your stool (feces) from other causes, such as ulcers or hemorrhoids. This test is usually done as part of an annual routine examination after age 21. WHAT KIND OF SAMPLE IS TAKEN? A sample of your stool is required for this test. Your health care provider may collect the sample with a swab of the rectum. Or, you may be instructed to collect the sample in a container at home. If you are instructed to collect the sample, your health care provider will provide you with the instructions and the  supplies that you will need to do that. WILL I NEED TO COLLECT SAMPLES AT HOME?  A stool sample may need to be collected at home. When collecting a sample at home, make sure that you:  Use the sterile containers and other supplies that were given to you from the lab.  Do not mix urine, toilet paper, or water with your sample.  Label all slides and containers with your name and the date when you collected the sample. Your health care provider or lab staff will give you one or more test "cards." You will collect a separate sample from three different stools, usually on different days that follow each other. Follow these steps for each sample: 14. Collect a stool sample into a clean container. 15. With an applicator stick, apply a thin smear of stool sample onto each filter paper square or window that is on the card. 16. Allow the filter paper to dry. After it is dry, the sample will be stable. Usually, you will return all of the samples to your health care provider or lab  at the same time.  HOW DO I PREPARE FOR THE TEST?  Do not eat any red meat within three days before testing.  Follow your health care provider's instructions about eating and drinking prior to the test. Your health care provider may instruct you to avoid other foods or substances.  Ask your health care provider about taking or not taking your medicines prior to the test. You may be instructed to avoid certain medicines that are known to interfere with this test. HOW ARE THE TEST RESULTS REPORTED? Your test results will be reported as either positive or negative. It is your responsibility to obtain your test results. Ask the lab or department performing the test when and how you will get your results. WHAT DO THE RESULTS MEAN? A negative test result means that there is no occult blood within the stool. A negative result is normal. A positive test result may mean that there is blood in the stool. Causes of blood in the stool include:  GI tumors.  Certain GI diseases.  GI trauma or recent surgery.  Hemorrhoids. Talk with your health care provider to discuss your results, treatment options, and if necessary, the need for more tests. Talk with your health care provider if you have any questions about your results.   This information is not intended to replace advice given to you by your health care provider. Make sure you discuss any questions you have with your health care provider.   Document Released: 02/09/2004 Document Revised: 02/04/2014 Document Reviewed: 06/11/2013 Elsevier Interactive Patient Education [employer]

## 2015-05-17 NOTE — ED Notes (Signed)
Pt with right upper toothache for 4 days, right ear pain and ringing for 3 days, also with lower abd pain, took ex lax last night and BM today at 1500 with mucus and blood in stool per pt.  Pt also noted frequent urination, denies burning with urination

## 2015-12-30 IMAGING — DX DG SHOULDER 2+V*R*
2 series · 2 of 2 positions shown · non-contrast
Comparison: 12/28/2012

CLINICAL DATA: Right shoulder pain, recent injury, popping
sensation.

EXAM:
RIGHT SHOULDER - 2+ VIEW

[shoulder grashey]
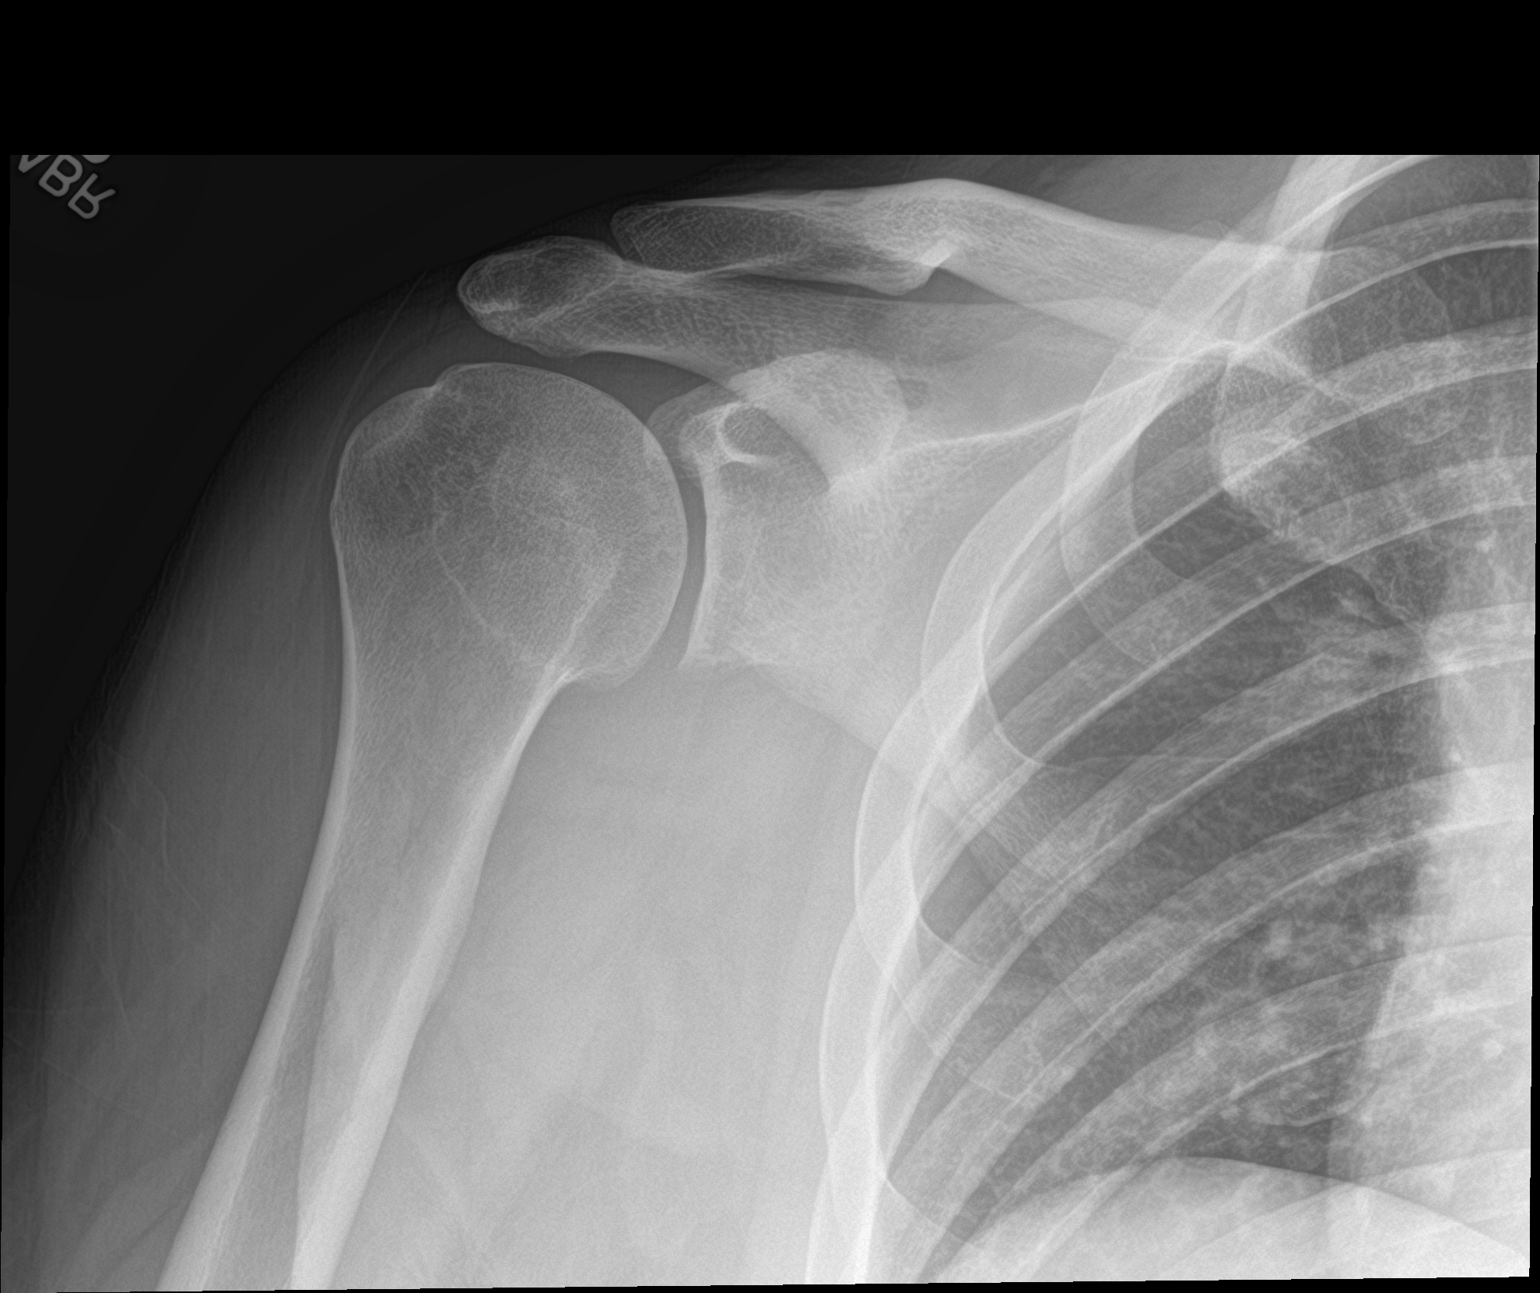

[shoulder y view]
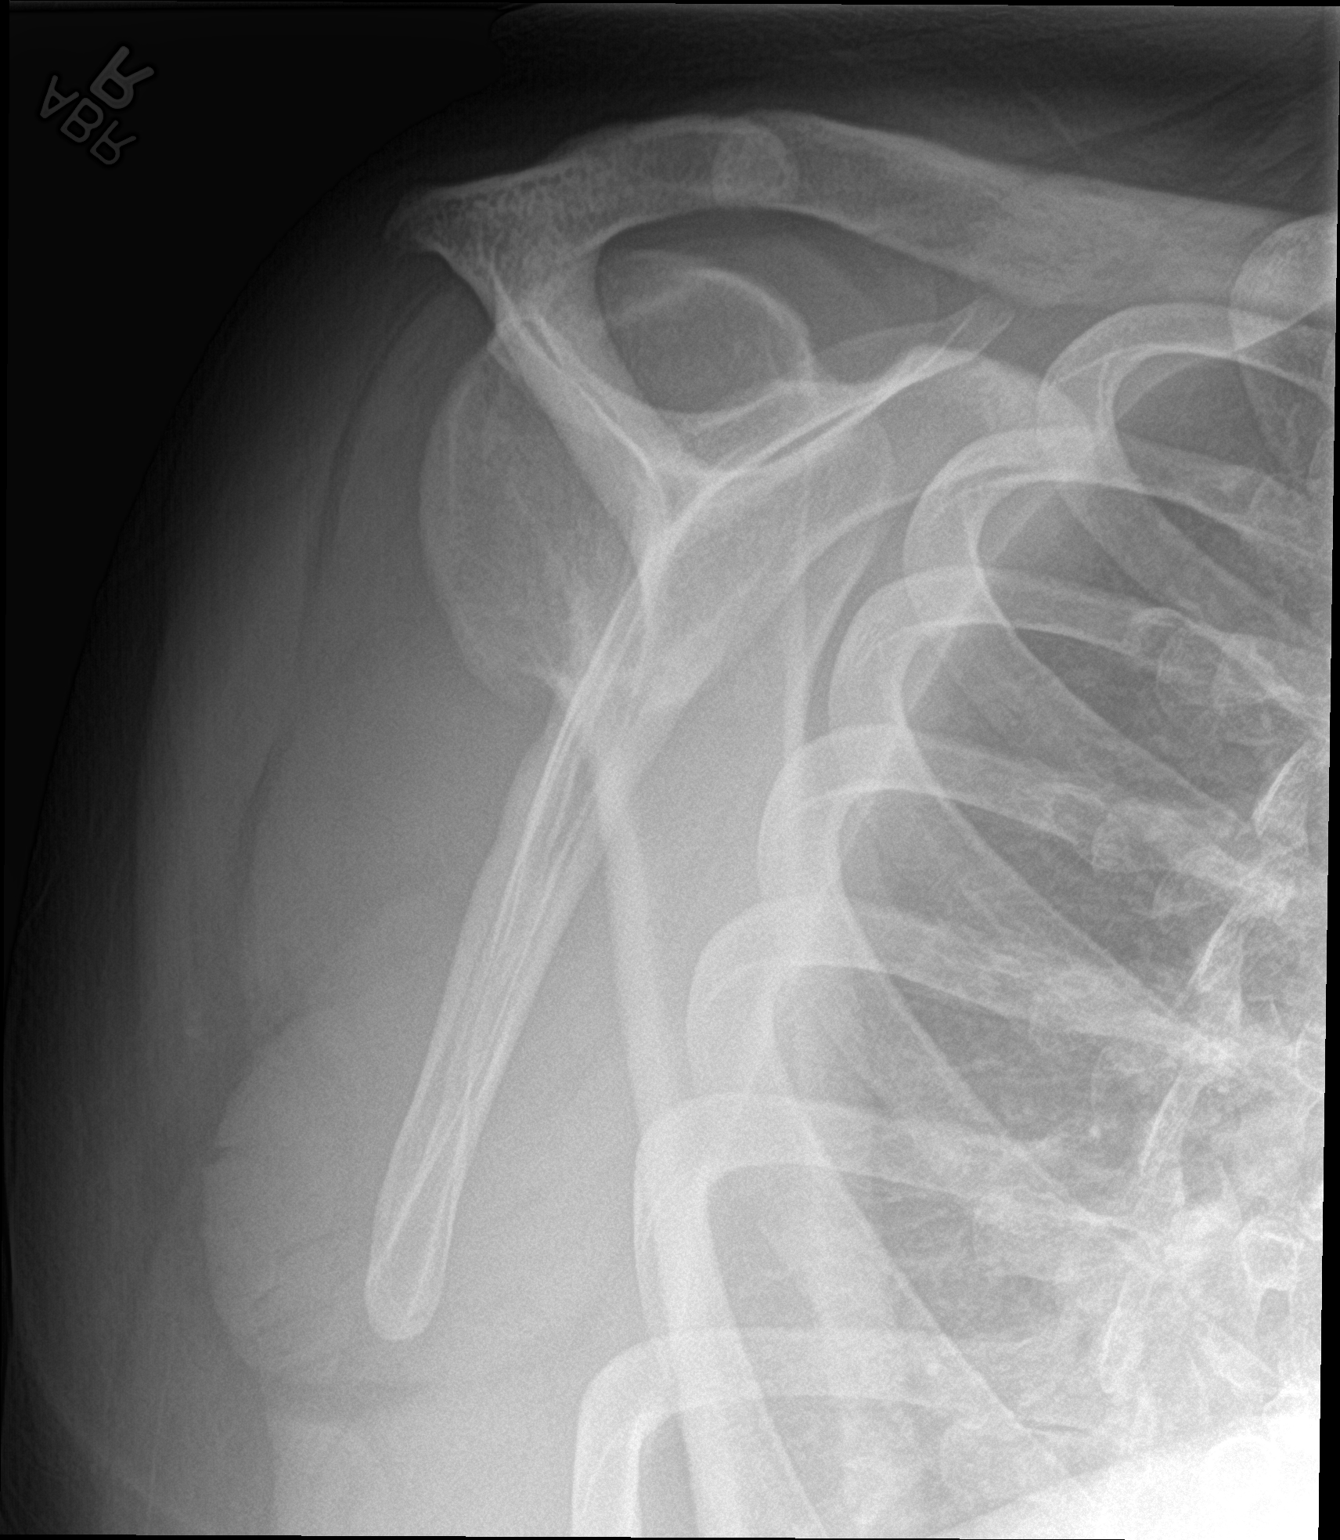

[2 of 2 positions shown; findings below may reference images not displayed]

FINDINGS: Normal alignment without acute fracture. Minimal sclerosis and
irregularity of the glenoid from a remote glenoid fracture in 5281.
AC joint aligned. No subluxation or dislocation. Visualize right
lung clear. Right ribs intact.
IMPRESSION: No acute osseous finding.
